# Patient Record
Sex: Male | Born: 1950 | Race: White | Hispanic: No | Marital: Single | Smoking: Former smoker
Health system: Southern US, Community
[De-identification: ages and names within clinical notes are randomized; demographics above are authoritative.]

## PROBLEM LIST (undated history)

## (undated) DIAGNOSIS — Z720 Tobacco use: Secondary | ICD-10-CM

## (undated) DIAGNOSIS — J439 Emphysema, unspecified: Secondary | ICD-10-CM

## (undated) DIAGNOSIS — R569 Unspecified convulsions: Secondary | ICD-10-CM

## (undated) HISTORY — PX: LAPAROTOMY: SHX154

## (undated) HISTORY — PX: KNEE SURGERY: SHX244

## (undated) HISTORY — DX: Hypercalcemia: E83.52

## (undated) HISTORY — DX: Emphysema, unspecified: J43.9

---

## 2005-04-23 ENCOUNTER — Emergency Department: Payer: Self-pay | Admitting: Emergency Medicine

## 2005-04-26 ENCOUNTER — Ambulatory Visit: Payer: Self-pay | Admitting: Orthopaedic Surgery

## 2005-05-05 ENCOUNTER — Ambulatory Visit: Payer: Self-pay | Admitting: Orthopaedic Surgery

## 2011-02-10 ENCOUNTER — Emergency Department: Payer: Self-pay | Admitting: Emergency Medicine

## 2020-07-19 ENCOUNTER — Emergency Department: Payer: Medicare Other

## 2020-07-19 ENCOUNTER — Inpatient Hospital Stay
Admission: EM | Admit: 2020-07-19 | Discharge: 2020-07-25 | DRG: 823 | Disposition: A | Discharge status: Home Health | Payer: Medicare Other | Attending: Internal Medicine | Admitting: Internal Medicine

## 2020-07-19 ENCOUNTER — Encounter: Payer: Self-pay | Admitting: Emergency Medicine

## 2020-07-19 ENCOUNTER — Other Ambulatory Visit: Payer: Self-pay

## 2020-07-19 DIAGNOSIS — R918 Other nonspecific abnormal finding of lung field: Secondary | ICD-10-CM

## 2020-07-19 DIAGNOSIS — R627 Adult failure to thrive: Secondary | ICD-10-CM | POA: Diagnosis present

## 2020-07-19 DIAGNOSIS — D61818 Other pancytopenia: Secondary | ICD-10-CM | POA: Diagnosis present

## 2020-07-19 DIAGNOSIS — R630 Anorexia: Secondary | ICD-10-CM | POA: Diagnosis present

## 2020-07-19 DIAGNOSIS — C8334 Diffuse large B-cell lymphoma, lymph nodes of axilla and upper limb: Secondary | ICD-10-CM | POA: Diagnosis present

## 2020-07-19 DIAGNOSIS — K859 Acute pancreatitis without necrosis or infection, unspecified: Secondary | ICD-10-CM | POA: Diagnosis present

## 2020-07-19 DIAGNOSIS — F17210 Nicotine dependence, cigarettes, uncomplicated: Secondary | ICD-10-CM | POA: Diagnosis present

## 2020-07-19 DIAGNOSIS — Z825 Family history of asthma and other chronic lower respiratory diseases: Secondary | ICD-10-CM | POA: Diagnosis not present

## 2020-07-19 DIAGNOSIS — J439 Emphysema, unspecified: Secondary | ICD-10-CM | POA: Diagnosis present

## 2020-07-19 DIAGNOSIS — E162 Hypoglycemia, unspecified: Secondary | ICD-10-CM | POA: Diagnosis present

## 2020-07-19 DIAGNOSIS — Z681 Body mass index (BMI) 19 or less, adult: Secondary | ICD-10-CM | POA: Diagnosis not present

## 2020-07-19 DIAGNOSIS — E876 Hypokalemia: Secondary | ICD-10-CM | POA: Diagnosis present

## 2020-07-19 DIAGNOSIS — B370 Candidal stomatitis: Secondary | ICD-10-CM | POA: Diagnosis present

## 2020-07-19 DIAGNOSIS — Z20822 Contact with and (suspected) exposure to covid-19: Secondary | ICD-10-CM | POA: Diagnosis present

## 2020-07-19 DIAGNOSIS — R590 Localized enlarged lymph nodes: Secondary | ICD-10-CM | POA: Diagnosis present

## 2020-07-19 DIAGNOSIS — C8590 Non-Hodgkin lymphoma, unspecified, unspecified site: Secondary | ICD-10-CM

## 2020-07-19 DIAGNOSIS — R634 Abnormal weight loss: Secondary | ICD-10-CM | POA: Diagnosis not present

## 2020-07-19 HISTORY — DX: Unspecified convulsions: R56.9

## 2020-07-19 HISTORY — DX: Tobacco use: Z72.0

## 2020-07-19 HISTORY — DX: Other pancytopenia: D61.818

## 2020-07-19 LAB — URINALYSIS, COMPLETE (UACMP) WITH MICROSCOPIC
Bilirubin Urine: NEGATIVE
Glucose, UA: NEGATIVE mg/dL
Ketones, ur: NEGATIVE mg/dL
Leukocytes,Ua: NEGATIVE
Nitrite: NEGATIVE
Protein, ur: NEGATIVE mg/dL
Specific Gravity, Urine: 1.011 (ref 1.005–1.030)
Squamous Epithelial / HPF: NONE SEEN (ref 0–5)
pH: 5 (ref 5.0–8.0)

## 2020-07-19 LAB — BASIC METABOLIC PANEL
Anion gap: 14 (ref 5–15)
BUN: 61 mg/dL — ABNORMAL HIGH (ref 8–23)
CO2: 22 mmol/L (ref 22–32)
Calcium: 13.8 mg/dL (ref 8.9–10.3)
Chloride: 93 mmol/L — ABNORMAL LOW (ref 98–111)
Creatinine, Ser: 1.65 mg/dL — ABNORMAL HIGH (ref 0.61–1.24)
GFR, Estimated: 45 mL/min — ABNORMAL LOW (ref >60)
Glucose, Bld: 95 mg/dL (ref 70–99)
Potassium: 3.9 mmol/L (ref 3.5–5.1)
Sodium: 129 mmol/L — ABNORMAL LOW (ref 135–145)

## 2020-07-19 LAB — CBC
HCT: 29.3 % — ABNORMAL LOW (ref 39.0–52.0)
Hemoglobin: 10.1 g/dL — ABNORMAL LOW (ref 13.0–17.0)
MCH: 30.2 pg (ref 26.0–34.0)
MCHC: 34.5 g/dL (ref 30.0–36.0)
MCV: 87.7 fL (ref 80.0–100.0)
Platelets: 87 10*3/uL — ABNORMAL LOW (ref 150–400)
RBC: 3.34 MIL/uL — ABNORMAL LOW (ref 4.22–5.81)
RDW: 19.2 % — ABNORMAL HIGH (ref 11.5–15.5)
WBC: 2.6 10*3/uL — ABNORMAL LOW (ref 4.0–10.5)
nRBC: 0 % (ref 0.0–0.2)

## 2020-07-19 LAB — RESP PANEL BY RT-PCR (FLU A&B, COVID) ARPGX2
Influenza A by PCR: NEGATIVE
Influenza B by PCR: NEGATIVE
SARS Coronavirus 2 by RT PCR: NEGATIVE

## 2020-07-19 LAB — CK: Total CK: 46 U/L — ABNORMAL LOW (ref 49–397)

## 2020-07-19 LAB — TROPONIN I (HIGH SENSITIVITY): Troponin I (High Sensitivity): 17 ng/L (ref <18)

## 2020-07-19 MED ORDER — TRAZODONE HCL 50 MG PO TABS: 25.0000 mg | ORAL_TABLET | Freq: Every evening | ORAL | Status: DC | PRN

## 2020-07-19 MED ORDER — SODIUM CHLORIDE 0.9 % IV BOLUS
1000.0000 mL | Freq: Once | INTRAVENOUS | Status: AC
  Administered 2020-07-19: 1000 mL via INTRAVENOUS

## 2020-07-19 MED ORDER — SODIUM CHLORIDE 0.9 % IV SOLN: INTRAVENOUS | Status: DC

## 2020-07-19 MED ORDER — ACETAMINOPHEN 650 MG RE SUPP: 650.0000 mg | Freq: Four times a day (QID) | RECTAL | Status: DC | PRN

## 2020-07-19 MED ORDER — ACETAMINOPHEN 325 MG PO TABS
650.0000 mg | ORAL_TABLET | Freq: Four times a day (QID) | ORAL | Status: DC | PRN
  Administered 2020-07-21: 650 mg via ORAL
  Filled 2020-07-19: qty 2

## 2020-07-19 MED ORDER — ENOXAPARIN SODIUM 40 MG/0.4ML IJ SOSY
40.0000 mg | PREFILLED_SYRINGE | INTRAMUSCULAR | Status: DC
  Administered 2020-07-19 – 2020-07-24 (×6): 40 mg via SUBCUTANEOUS
  Filled 2020-07-19 (×6): qty 0.4

## 2020-07-19 NOTE — ED Notes (Signed)
Patient transported to CT 

## 2020-07-19 NOTE — ED Triage Notes (Addendum)
Pt via EMS from [facility]. Neighbors called EMS stating he wasn't acting himself that he was weaker than normal. Pt has not been eating for the past couple of days. Denies pain. Per EMS, pt was hypotensive at 89/67 and gave 553mL bolus. Pt is A&Ox4 and NAD. Pt is HOH and able to hear better on the R side.

## 2020-07-19 NOTE — H&P (Signed)
History and Physical    Patrick Shepard HER:[id number] DOB: 1950-12-30 DOA: 07/19/2020  PCP: Pcp, No (Confirm with patient/family/NH records and if not entered, this has to be entered at [facility] point of entry) Patient coming from: Assisted Living  I have personally briefly reviewed patient's old medical records in [place]  Chief Complaint: weakness  HPI: Patrick Shepard is a 70 y.o. male with no prior  medical history, hasn't seen a doctor for many years.  He presents to the emergency department for generalized weakness.  Patient is coming from the [facility] for generalized weakness.  Patient states he goes outside to smoke cigarettes several times during the day states he has been so weak that he was unable to do so today.He also required assistance to shower.   Patient denies any chest pain or abdominal pain.  Denies any nausea vomiting or diarrhea. .  ED Course: T 99  95/71  HR 83  R22. Patient appears chronically ill. He has had 20+ lbs weight loss since December per his sister. Lab: Na 129, Cr 1.65, BUN 61  Ca 13.8  Trop 45, WBC 2.6, Hgb 10.1 Plt 87. EKG with old anterior injury. CXR NAD. TRH called to admit for further evaluation of weight loss, pancytopenia, hypercalcemia.  Review of Systems: As per HPI otherwise 10 point review of systems negative. HOH, has daily cough, has had loss of appetite for many weeks, hardly eating at all for 3 weeks.    Past Medical History:  Diagnosis Date  . Seizure in childhood [facility])    no seizure for many years  . Tobacco abuse     Past Surgical History:  Procedure Laterality Date  . KNEE SURGERY Left    for injury. Did not have knee replacement  . LAPAROTOMY     done at age 48 for intestinal issues    Soc Hx- was married twice, divorced twice. No children. [place]. 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 Heavy smoker for many years.   reports  that he has been smoking. He has been smoking about 1.00 pack per day. He has quit using smokeless tobacco. No history on file for alcohol use and drug use.  No Known Allergies  Family History  Problem Relation Age of Onset  . COPD Mother   . Alcoholism Father   . Cirrhosis Father      Prior to Admission medications   Not on File  Takes no medications  Physical Exam: Vitals:   07/19/20 1930 07/19/20 2015 07/19/20 2030 07/19/20 2146  BP: 95/71 99/63 106/67 115/76  Pulse: 83 88 87 90  Resp: (!) 22 17 18 20   Temp:    98.6 F (37 C)  TempSrc:    Oral  SpO2: 99% 98% 98% 100%  Weight:      Height:         Vitals:   07/19/20 1930 07/19/20 2015 07/19/20 2030 07/19/20 2146  BP: 95/71 99/63 106/67 115/76  Pulse: 83 88 87 90  Resp: (!) 22 17 18 20   Temp:    98.6 F (37 C)  TempSrc:    Oral  SpO2: 99% 98% 98% 100%  Weight:      Height:       General: Very thin with temporal wasting, prominent ribs, prominent spinous processes. Eyes: PERRL, lids and conjunctivae normal ENMT: Mucous membranes are moist. Posterior pharynx  clear of any exudate or lesions. Missing teeth maxilla, poor dentition mandibule.  Neck: normal, supple, no masses, no thyromegaly Nodes - no palpable nodes supraclavicular, axillary. Small node right groin Respiratory: clear to auscultation bilaterally, no wheezing, no crackles. Normal respiratory effort. No accessory muscle use.  Cardiovascular: Regular rate and rhythm, no murmurs / rubs / gallops. No extremity edema. 2+ pedal pulses. No carotid bruits.  Abdomen: scaphoid, no tenderness, no masses palpated. No hepatosplenomegaly. Bowel sounds positive.  Musculoskeletal: no clubbing / cyanosis. No joint deformity upper and lower extremities. Surgical scar left knee. Good ROM, no contractures. Normal muscle tone.  Skin: no rashes, lesions, ulcers. No induration Neurologic: CN 2-12 grossly intact but HOH. Sensation intact, DTR hyporeflexic  Patellar tendon,  hyper-reflexic at achilles.. Strength 5/5 in all 4.  Psychiatric: Normal judgment but lacks insight to his physical state. Alert and oriented x 3. Normal mood.     Labs on Admission: I have personally reviewed following labs and imaging studies  CBC: Recent Labs  Lab 07/19/20 1751  WBC 2.6*  HGB 10.1*  HCT 29.3*  MCV 87.7  PLT 87*   Basic Metabolic Panel: Recent Labs  Lab 07/19/20 1751  NA 129*  K 3.9  CL 93*  CO2 22  GLUCOSE 95  BUN 61*  CREATININE 1.65*  CALCIUM 13.8*   GFR: Estimated Creatinine Clearance: 40.9 mL/min (A) (by C-G formula based on SCr of 1.65 mg/dL (H)). Liver Function Tests: No results for input(s): AST, ALT, ALKPHOS, BILITOT, PROT, ALBUMIN in the last 168 hours. No results for input(s): LIPASE, AMYLASE in the last 168 hours. No results for input(s): AMMONIA in the last 168 hours. Coagulation Profile: No results for input(s): INR, PROTIME in the last 168 hours. Cardiac Enzymes: Recent Labs  Lab 07/19/20 1751  CKTOTAL 46*   BNP (last 3 results) No results for input(s): PROBNP in the last 8760 hours. HbA1C: No results for input(s): HGBA1C in the last 72 hours. CBG: No results for input(s): GLUCAP in the last 168 hours. Lipid Profile: No results for input(s): CHOL, HDL, LDLCALC, TRIG, CHOLHDL, LDLDIRECT in the last 72 hours. Thyroid Function Tests: No results for input(s): TSH, T4TOTAL, FREET4, T3FREE, THYROIDAB in the last 72 hours. Anemia Panel: No results for input(s): VITAMINB12, FOLATE, FERRITIN, TIBC, IRON, RETICCTPCT in the last 72 hours. Urine analysis:    Component Value Date/Time   COLORURINE YELLOW (A) 07/19/2020 2044   APPEARANCEUR CLEAR (A) 07/19/2020 2044   LABSPEC 1.011 07/19/2020 2044   PHURINE 5.0 07/19/2020 2044   GLUCOSEU NEGATIVE 07/19/2020 2044   HGBUR SMALL (A) 07/19/2020 2044   BILIRUBINUR NEGATIVE 07/19/2020 2044   KETONESUR NEGATIVE 07/19/2020 2044   PROTEINUR NEGATIVE 07/19/2020 2044   NITRITE NEGATIVE  07/19/2020 2044   LEUKOCYTESUR NEGATIVE 07/19/2020 2044    Radiological Exams on Admission: CT Chest Wo Contrast  Result Date: 07/19/2020 CLINICAL DATA:  Dyspnea. Interstitial lung disease suspected. Altered mental status over the last couple of days. EXAM: CT CHEST WITHOUT CONTRAST TECHNIQUE: Multidetector CT imaging of the chest was performed following the standard protocol without IV contrast. COMPARISON:  Current and prior chest radiographs. FINDINGS: Cardiovascular: Heart normal in size. Coronary artery calcifications. Aortic atherosclerosis. Mediastinum/Nodes: Enlarged right axillary lymph nodes, largest measuring 1.3 cm in short axis. There are shoddy subcentimeter neck base lymph nodes. There are several prominent to mildly enlarged mediastinal lymph nodes. Azygos level right paratracheal node measures 1.3 cm in short axis. Subcarinal node measures 1.8 cm in short axis. Probable mildly  enlarged inferior right hilar lymph node, 1.3 cm in short axis. Trachea and esophagus are unremarkable. Lungs/Pleura: Lungs demonstrate interstitial thickening peripheral cystic spaces, with a predominance in the mid to upper lungs. There is an ill-defined 1.1 cm nodular opacity in the left lower lobe, image 91, series 3. No convincing pneumonia. No pulmonary edema. No pleural effusion or pneumothorax. Upper Abdomen: Enlarged spleen, 15 cm in greatest transverse dimension. Prominent to mildly enlarged lymph nodes in the upper abdomen, in the visualized retroperitoneum and along the Peri celiac chain. Low-density liver lesions consistent with cysts. Musculoskeletal: Skeletal structures are demineralized. There multiple vertebral compression fractures, T4, T8, T9 as well as L1, which appear chronic. No bone lesions. IMPRESSION: 1. Interstitial lung disease reflected by peripheral cystic change, interstitial thickening, areas of honeycombing and architectural distortion, with a mid to upper lung predominance. 2. 1.1 cm  ill-defined nodular opacity in the left lower lobe. This could be inflammatory. Neoplastic disease is possible. Consider one of the following in 3 months for both low-risk and high-risk individuals: (a) repeat chest CT, (b) follow-up PET-CT, or (c) tissue sampling. This recommendation follows the consensus statement: Guidelines for Management of Incidental Pulmonary Nodules Detected on CT Images: From the Fleischner Society 2017; Radiology 2017; 284:228-243. No other findings in the lungs to suggest active inflammation or infection. No pulmonary edema. 3. Enlarged lymph nodes, most apparent in the right axilla, milder adenopathy along the mediastinum and right hilum, with adenopathy also noted in the visualized upper abdomen. There is also splenomegaly. The combination of findings is concerning for a lymphoproliferative disorder including lymphoma. 4. Coronary artery calcifications and aortic atherosclerosis. Aortic Atherosclerosis (ICD10-I70.0). Electronically Signed   By: Lajean Manes M.D.   On: 07/19/2020 21:07   DG Chest Portable 1 View  Result Date: 07/19/2020 CLINICAL DATA:  Weakness. EXAM: PORTABLE CHEST 1 VIEW COMPARISON:  02/10/2011. FINDINGS: Cardiac silhouette is normal in size. Normal mediastinal and hilar contours. Lungs show bilateral interstitial thickening similar to the prior exam allowing for differences in radiographic technique. No lung consolidation. No pleural effusion or pneumothorax. Skeletal structures are grossly intact. IMPRESSION: 1. No acute cardiopulmonary disease. 2. Chronic bilateral interstitial thickening. Electronically Signed   By: Lajean Manes M.D.   On: 07/19/2020 19:56    EKG: Independently reviewed. Sinus rhythm w/o acute injury. Old anterior injury  Assessment/Plan Active Problems:   Hypercalcemia   Weight loss, abnormal   Pancytopenia (HCC)   Emphysema lung ([place])    1. Hypercalcemia - patient not symptomatic with calcium 13.8. CT chest w/o mass, small  nodule noted. Multiple locations lymphadenopathy. Plan IVF - received 2L NS in ED, will continue IV hydration with NS  iPTH and ionized calcium pending  Will need heme/onc consult for possible lymphoprolifitive disease  F/u Calcium level and Bmet in AM  2. Pancytopenia and weight loss - likely related to #1 and will need heme/onc consult  3. Pulmonary - heavy smoker at 2 ppd for many years. CT reveals probable emphysema. No oxygen needs. No wheezing.    DVT prophylaxis: lovenox  Code Status: full code  Family Communication: sister present during interview and exam and was a source of information.  Disposition Plan: TBD  Consults called: heme/onc - will need to be called in AM  Admission status: inpatient    Adella Hare MD [facility] Pager [phone]  If 7PM-7AM, please contact night-coverage [email or web address] Password [facility]  07/19/2020, 9:50 PM

## 2020-07-19 NOTE — ED Notes (Signed)
Ice chips given at this time. EDP approval at this time.

## 2020-07-19 NOTE — ED Notes (Signed)
ED Provider at bedside. 

## 2020-07-19 NOTE — ED Notes (Signed)
Hospitalist at bedside 

## 2020-07-19 NOTE — ED Notes (Signed)
X-ray at bedside

## 2020-07-19 NOTE — ED Provider Notes (Signed)
[facility] Emergency Department Provider Note  Time seen: 6:10 PM  I have reviewed the triage vital signs and the nursing notes.   HISTORY  Chief Complaint Weakness   HPI Patrick Shepard is a 70 y.o. male presents to the emergency department for generalized weakness.  Patient is coming from the [facility] home for generalized weakness.  Patient states he goes outside to smoke cigarettes several times during the day states he has been so weak that he was unable to do so today.   Patient denies any chest pain or abdominal pain.  Denies any nausea vomiting or diarrhea.  Patient found to be hypotensive upon arrival 89/67 of EMS and given 500 cc of fluid, remains hypotensive around 90 systolic upon arrival.  Largely negative review of systems otherwise besides generalized weakness.  No past medical history on file.  There are no problems to display for this patient.   Prior to Admission medications   Not on File    No Known Allergies  No family history on file.  Social History Social History   Tobacco Use  . Smoking status: Current Every Day Smoker    Packs/day: 1.00  . Smokeless tobacco: Former Network engineer  . Vaping Use: Never used    Review of Systems Constitutional: Positive for generalized weakness.  Negative for fever. Cardiovascular: Negative for chest pain. Respiratory: Negative for shortness of breath.  Negative for cough. Gastrointestinal: Negative for abdominal pain, vomiting and diarrhea. Genitourinary: Negative for urinary compaints Musculoskeletal: Negative for musculoskeletal complaints Neurological: Negative for headache All other ROS negative  ____________________________________________   PHYSICAL EXAM:  VITAL SIGNS: ED Triage Vitals  Enc Vitals Group     BP 07/19/20 1754 99/70     Pulse Rate 07/19/20 1754 90     Resp 07/19/20 1754 16     Temp 07/19/20 1754 99 F (37.2 C)     Temp Source 07/19/20 1754 Oral      SpO2 07/19/20 1754 100 %     Weight 07/19/20 1755 160 lb (72.6 kg)     Height 07/19/20 1755 5\' 8"  (1.727 m)     Head Circumference --      Peak Flow --      Pain Score 07/19/20 1755 0     Pain Loc --      Pain Edu? --      Excl. in [place]? --    Constitutional: Alert and oriented. Well appearing and in no distress. Eyes: Normal exam ENT      Head: Normocephalic and atraumatic.      Mouth/Throat: Mucous membranes are moist. Cardiovascular: Normal rate, regular rhythm. Respiratory: Normal respiratory effort without tachypnea nor retractions. Breath sounds are clear  Gastrointestinal: Soft and nontender. No distention. Musculoskeletal: Nontender with normal range of motion in all extremities. Neurologic:  Normal speech and language. No gross focal neurologic deficits  Skin:  Skin is warm, dry and intact.  Psychiatric: Mood and affect are normal.  ____________________________________________    EKG  EKG viewed and interpreted by myself shows a normal sinus rhythm at 98 bpm with a narrow QRS, normal axis, normal intervals, nonspecific ST changes.  ____________________________________________    RADIOLOGY  Chest x-ray is negative.  ____________________________________________   INITIAL IMPRESSION / ASSESSMENT AND PLAN / ED COURSE  Pertinent labs & imaging results that were available during my care of the patient were reviewed by me and considered in my medical decision making (see chart for details).  Patient presents emergency department for generalized weakness and fatigue today.  Largely negative review of systems otherwise.  Differential is quite broad but would include metabolic or electrolyte abnormality, dehydration, anemia, infectious etiologies such as COVID or urinary tract infection.  We will check labs and continue to closely monitor.  We will IV hydrate while awaiting results given the patient's initial borderline hypotension.  Patient's blood pressure is improving  with fluids currently 100/59.  Patient's labs have resulted showing significant hypercalcemia at 13.8.  Renal insufficiency with a creatinine 1.65 with no old labs for comparison.  Patient's COVID and flu tests are negative.  CBC shows mild anemia with leukopenia and thrombocytopenia.  Again no old labs for comparison.  We will continue with IV hydration for the hypercalcemia, troponin is pending as well as a urinalysis.  We will also obtain a chest x-ray as a precaution.  Patient will require admission to the hospital service once his emergency department work-up is been completed.  Patient remained stable in the emergency department.  Chest x-ray negative.  Patient cause for hypercalcemia is unclear at this time.  Given his chronic smoking history there is still considerable concern for oncologic process.  I spoke to the hospitalist who will be admitting the patient to his service he recommends CT scan of the chest without contrast as well as parathyroid hormone level and ionized calcium which I have ordered.  Patrick Shepard was evaluated in Emergency Department on 07/19/2020 for the symptoms described in the history of present illness. He was evaluated in the context of the global COVID-19 pandemic, which necessitated consideration that the patient might be at risk for infection with the SARS-CoV-2 virus that causes COVID-19. Institutional protocols and algorithms that pertain to the evaluation of patients at risk for COVID-19 are in a state of rapid change based on information released by regulatory bodies including the CDC and federal and state organizations. These policies and algorithms were followed during the patient's care in the ED.  ____________________________________________   FINAL CLINICAL IMPRESSION(S) / ED DIAGNOSES  Weakness Hypercalcemia   Patrick Dark, MD 07/19/20 2003

## 2020-07-20 ENCOUNTER — Encounter: Payer: Self-pay | Admitting: Internal Medicine

## 2020-07-20 ENCOUNTER — Inpatient Hospital Stay: Payer: Medicare Other

## 2020-07-20 DIAGNOSIS — R918 Other nonspecific abnormal finding of lung field: Secondary | ICD-10-CM

## 2020-07-20 HISTORY — DX: Other nonspecific abnormal finding of lung field: R91.8

## 2020-07-20 LAB — DIFFERENTIAL
Abs Immature Granulocytes: 0.03 10*3/uL (ref 0.00–0.07)
Basophils Absolute: 0 10*3/uL (ref 0.0–0.1)
Basophils Relative: 1 %
Eosinophils Absolute: 0 10*3/uL (ref 0.0–0.5)
Eosinophils Relative: 0 %
Immature Granulocytes: 1 %
Lymphocytes Relative: 13 %
Lymphs Abs: 0.3 10*3/uL — ABNORMAL LOW (ref 0.7–4.0)
Monocytes Absolute: 0.2 10*3/uL (ref 0.1–1.0)
Monocytes Relative: 8 %
Neutro Abs: 1.7 10*3/uL (ref 1.7–7.7)
Neutrophils Relative %: 77 %

## 2020-07-20 LAB — TECHNOLOGIST SMEAR REVIEW: Plt Morphology: DECREASED

## 2020-07-20 LAB — HEPATIC FUNCTION PANEL
ALT: 22 U/L (ref 0–44)
AST: 70 U/L — ABNORMAL HIGH (ref 15–41)
Albumin: 3 g/dL — ABNORMAL LOW (ref 3.5–5.0)
Alkaline Phosphatase: 65 U/L (ref 38–126)
Bilirubin, Direct: 0.5 mg/dL — ABNORMAL HIGH (ref 0.0–0.2)
Indirect Bilirubin: 1.1 mg/dL — ABNORMAL HIGH (ref 0.3–0.9)
Total Bilirubin: 1.6 mg/dL — ABNORMAL HIGH (ref 0.3–1.2)
Total Protein: 5 g/dL — ABNORMAL LOW (ref 6.5–8.1)

## 2020-07-20 LAB — BASIC METABOLIC PANEL
Anion gap: 10 (ref 5–15)
BUN: 56 mg/dL — ABNORMAL HIGH (ref 8–23)
CO2: 23 mmol/L (ref 22–32)
Calcium: 12.9 mg/dL — ABNORMAL HIGH (ref 8.9–10.3)
Chloride: 100 mmol/L (ref 98–111)
Creatinine, Ser: 1.41 mg/dL — ABNORMAL HIGH (ref 0.61–1.24)
GFR, Estimated: 54 mL/min — ABNORMAL LOW (ref >60)
Glucose, Bld: 82 mg/dL (ref 70–99)
Potassium: 3.9 mmol/L (ref 3.5–5.1)
Sodium: 133 mmol/L — ABNORMAL LOW (ref 135–145)

## 2020-07-20 LAB — CBC
HCT: 31.2 % — ABNORMAL LOW (ref 39.0–52.0)
Hemoglobin: 10.7 g/dL — ABNORMAL LOW (ref 13.0–17.0)
MCH: 30.4 pg (ref 26.0–34.0)
MCHC: 34.3 g/dL (ref 30.0–36.0)
MCV: 88.6 fL (ref 80.0–100.0)
Platelets: 83 10*3/uL — ABNORMAL LOW (ref 150–400)
RBC: 3.52 MIL/uL — ABNORMAL LOW (ref 4.22–5.81)
RDW: 19.9 % — ABNORMAL HIGH (ref 11.5–15.5)
WBC: 2.2 10*3/uL — ABNORMAL LOW (ref 4.0–10.5)
nRBC: 0 % (ref 0.0–0.2)

## 2020-07-20 LAB — LACTATE DEHYDROGENASE: LDH: 254 U/L — ABNORMAL HIGH (ref 98–192)

## 2020-07-20 MED ORDER — SODIUM CHLORIDE 0.9 % IV SOLN
12.5000 mg | Freq: Four times a day (QID) | INTRAVENOUS | Status: DC | PRN
  Filled 2020-07-20: qty 0.5

## 2020-07-20 MED ORDER — CALCITONIN (SALMON) 200 UNIT/ML IJ SOLN
4.0000 [IU]/kg | Freq: Two times a day (BID) | INTRAMUSCULAR | Status: AC
  Administered 2020-07-20 – 2020-07-21 (×4): 290 [IU] via SUBCUTANEOUS
  Filled 2020-07-20 (×4): qty 1.45

## 2020-07-20 MED ORDER — ZOLEDRONIC ACID 4 MG/5ML IV CONC
4.0000 mg | Freq: Once | INTRAVENOUS | Status: AC
  Administered 2020-07-20: 4 mg via INTRAVENOUS
  Filled 2020-07-20: qty 5

## 2020-07-20 MED ORDER — ONDANSETRON HCL 4 MG/2ML IJ SOLN
4.0000 mg | Freq: Four times a day (QID) | INTRAMUSCULAR | Status: DC | PRN
  Administered 2020-07-20 – 2020-07-21 (×2): 4 mg via INTRAVENOUS
  Filled 2020-07-20 (×2): qty 2

## 2020-07-20 MED ORDER — IOHEXOL 9 MG/ML PO SOLN
500.0000 mL | ORAL | Status: AC
  Administered 2020-07-20 (×2): 500 mL via ORAL

## 2020-07-20 MED ORDER — OXYCODONE HCL 5 MG PO TABS
5.0000 mg | ORAL_TABLET | ORAL | Status: DC | PRN
  Administered 2020-07-22: 5 mg via ORAL
  Filled 2020-07-20: qty 1

## 2020-07-20 MED ORDER — LORAZEPAM 1 MG PO TABS
1.0000 mg | ORAL_TABLET | Freq: Four times a day (QID) | ORAL | Status: DC | PRN
  Administered 2020-07-22: 1 mg via ORAL
  Filled 2020-07-20: qty 1

## 2020-07-20 NOTE — Progress Notes (Signed)
Progress Note    Patrick Shepard   ZOX:[id number]  DOB: 08/08/1950  DOA: 07/19/2020     1  PCP: Pcp, No   CC: weight loss, weakness  Hospital Course: Patrick Shepard is a 70 y.o. male with PMH tobacco use who presented to the ER with worsening weakness at home.  He is an ongoing tobacco user with approximately 100 pack-year smoking history (still smoking approximately 2 packs/day).  He also has not sought any medical care since approximately 2007 due to an aversion to healthcare per his sister.  She also stated that she has not seen him since Christmas and he had lost a significant amount of weight since then, possibly 20 to 30 pounds she guesses. He underwent CT chest on admission which showed a left lower lobe opacity measuring 1.1 cm and enlarged axillary lymphadenopathy along with mediastinal lymphadenopathy. He had notable hypercalcemia on admission as well.  He was started on fluids and admitted for further work-up and treatment. Oncology was also consulted on admission.   Interval History:  Seen this morning in his room with his sister bedside.  We reviewed CT findings and underlying suspicion for malignancy.  He also confirmed his longstanding significant smoking history.  Patient understands further work-up being commenced for the underlying lung mass.  ROS: Constitutional: negative for chills and fevers, Respiratory: negative for cough, Cardiovascular: negative for chest pain and Gastrointestinal: negative for abdominal pain  Assessment & Plan:  Lung lesion - Left lower lobe opacity measuring 1.1 cm with associated axillary and mediastinal lymphadenopathy seen on CT chest.  Given his longstanding smoking history, concern would be for underlying malignancy.  There is also hypercalcemia present on admission - Oncology consulted for assistance with further work-up - Follow-up CT abdomen/pelvis for further staging  Hypercalcemia  - Calcium 13.8 on admission but no  albumin to calculate corrected calcium however this morning his albumin is 3 so likely had pretty significant hypercalcemia on admission with correction - Will give calcitonin and zoledronic acid - Follow-up repeat calcium levels - Start on fluids  Pancytopenia - suspected due to underlying malignancy as noted above  Tobacco use - patient states started smoking approx 70 years old. Has approx 100 pack year history - cessation was recommended strongly.  - nicotine patch as needed  Old records reviewed in assessment of this patient  Antimicrobials:   DVT prophylaxis: enoxaparin (LOVENOX) injection 40 mg Start: 07/19/20 2200   Code Status:   Code Status: Full Code Family Communication: sister  Disposition Plan: Status is: Inpatient  Remains inpatient appropriate because:Ongoing diagnostic testing needed not appropriate for outpatient work up, IV treatments appropriate due to intensity of illness or inability to take PO and Inpatient level of care appropriate due to severity of illness   Dispo: The patient is from: Home              Anticipated d/c is to: Home              Patient currently is not medically stable to d/c.   Difficult to place patient No  Risk of unplanned readmission score: Unplanned Admission- Pilot do not use: 10.47   Objective: Blood pressure 116/79, pulse 83, temperature 97.7 F (36.5 C), temperature source Oral, resp. rate 20, height 5\' 8"  (1.727 m), weight 72.6 kg, SpO2 100 %.  Examination: General appearance: Cachectic adult man laying in bed appearing older than stated age but is comfortable Head: Normocephalic, without obvious abnormality, atraumatic Eyes: EOMI Lungs: clear  to auscultation bilaterally Heart: regular rate and rhythm and S1, S2 normal Abdomen: Prominently exposed ribs due to cachexia.  Thin, nontender, bowel sounds present Extremities: No edema Skin: mobility and turgor normal Neurologic: Grossly normal  Consultants:    Oncology  Procedures:     Data Reviewed: I have personally reviewed following labs and imaging studies Results for orders placed or performed during the hospital encounter of 07/19/20 (from the past 24 hour(s))  Basic metabolic panel     Status: Abnormal   Collection Time: 07/19/20  5:51 PM  Result Value Ref Range   Sodium 129 (L) 135 - 145 mmol/L   Potassium 3.9 3.5 - 5.1 mmol/L   Chloride 93 (L) 98 - 111 mmol/L   CO2 22 22 - 32 mmol/L   Glucose, Bld 95 70 - 99 mg/dL   BUN 61 (H) 8 - 23 mg/dL   Creatinine, Ser 1.65 (H) 0.61 - 1.24 mg/dL   Calcium 13.8 (HH) 8.9 - 10.3 mg/dL   GFR, Estimated 45 (L) >60 mL/min   Anion gap 14 5 - 15  CBC     Status: Abnormal   Collection Time: 07/19/20  5:51 PM  Result Value Ref Range   WBC 2.6 (L) 4.0 - 10.5 K/uL   RBC 3.34 (L) 4.22 - 5.81 MIL/uL   Hemoglobin 10.1 (L) 13.0 - 17.0 g/dL   HCT 29.3 (L) 39.0 - 52.0 %   MCV 87.7 80.0 - 100.0 fL   MCH 30.2 26.0 - 34.0 pg   MCHC 34.5 30.0 - 36.0 g/dL   RDW 19.2 (H) 11.5 - 15.5 %   Platelets 87 (L) 150 - 400 K/uL   nRBC 0.0 0.0 - 0.2 %  CK     Status: Abnormal   Collection Time: 07/19/20  5:51 PM  Result Value Ref Range   Total CK 46 (L) 49 - 397 U/L  Troponin I (High Sensitivity)     Status: None   Collection Time: 07/19/20  5:51 PM  Result Value Ref Range   Troponin I (High Sensitivity) 17 <18 ng/L  Resp Panel by RT-PCR (Flu A&B, Covid) Nasopharyngeal Swab     Status: None   Collection Time: 07/19/20  6:26 PM   Specimen: Nasopharyngeal Swab; Nasopharyngeal(NP) swabs in vial transport medium  Result Value Ref Range   SARS Coronavirus 2 by RT PCR NEGATIVE NEGATIVE   Influenza A by PCR NEGATIVE NEGATIVE   Influenza B by PCR NEGATIVE NEGATIVE  Urinalysis, Complete w Microscopic Urine, Clean Catch     Status: Abnormal   Collection Time: 07/19/20  8:44 PM  Result Value Ref Range   Color, Urine YELLOW (A) YELLOW   APPearance CLEAR (A) CLEAR   Specific Gravity, Urine 1.011 1.005 - 1.030    pH 5.0 5.0 - 8.0   Glucose, UA NEGATIVE NEGATIVE mg/dL   Hgb urine dipstick SMALL (A) NEGATIVE   Bilirubin Urine NEGATIVE NEGATIVE   Ketones, ur NEGATIVE NEGATIVE mg/dL   Protein, ur NEGATIVE NEGATIVE mg/dL   Nitrite NEGATIVE NEGATIVE   Leukocytes,Ua NEGATIVE NEGATIVE   RBC / HPF 0-5 0 - 5 RBC/hpf   WBC, UA 0-5 0 - 5 WBC/hpf   Bacteria, UA RARE (A) NONE SEEN   Squamous Epithelial / LPF NONE SEEN 0 - 5   Mucus PRESENT    Sperm, UA PRESENT   Basic metabolic panel     Status: Abnormal   Collection Time: 07/20/20  6:07 AM  Result Value Ref Range   Sodium  133 (L) 135 - 145 mmol/L   Potassium 3.9 3.5 - 5.1 mmol/L   Chloride 100 98 - 111 mmol/L   CO2 23 22 - 32 mmol/L   Glucose, Bld 82 70 - 99 mg/dL   BUN 56 (H) 8 - 23 mg/dL   Creatinine, Ser 1.41 (H) 0.61 - 1.24 mg/dL   Calcium 12.9 (H) 8.9 - 10.3 mg/dL   GFR, Estimated 54 (L) >60 mL/min   Anion gap 10 5 - 15  Hepatic function panel     Status: Abnormal   Collection Time: 07/20/20 11:18 AM  Result Value Ref Range   Total Protein 5.0 (L) 6.5 - 8.1 g/dL   Albumin 3.0 (L) 3.5 - 5.0 g/dL   AST 70 (H) 15 - 41 U/L   ALT 22 0 - 44 U/L   Alkaline Phosphatase 65 38 - 126 U/L   Total Bilirubin 1.6 (H) 0.3 - 1.2 mg/dL   Bilirubin, Direct 0.5 (H) 0.0 - 0.2 mg/dL   Indirect Bilirubin 1.1 (H) 0.3 - 0.9 mg/dL  Differential     Status: Abnormal   Collection Time: 07/20/20 11:18 AM  Result Value Ref Range   Neutrophils Relative % 77 %   Neutro Abs 1.7 1.7 - 7.7 K/uL   Lymphocytes Relative 13 %   Lymphs Abs 0.3 (L) 0.7 - 4.0 K/uL   Monocytes Relative 8 %   Monocytes Absolute 0.2 0.1 - 1.0 K/uL   Eosinophils Relative 0 %   Eosinophils Absolute 0.0 0.0 - 0.5 K/uL   Basophils Relative 1 %   Basophils Absolute 0.0 0.0 - 0.1 K/uL   Immature Granulocytes 1 %   Abs Immature Granulocytes 0.03 0.00 - 0.07 K/uL  Lactate dehydrogenase     Status: Abnormal   Collection Time: 07/20/20 11:18 AM  Result Value Ref Range   LDH 254 (H) 98 - 192 U/L   Technologist smear review     Status: None   Collection Time: 07/20/20 11:18 AM  Result Value Ref Range   WBC MORPHOLOGY MORPHOLOGY UNREMARKABLE    RBC MORPHOLOGY POLYCHROMASIA PRESENT    Tech Review PLATELETS APPEAR DECREASED   CBC     Status: Abnormal   Collection Time: 07/20/20 11:18 AM  Result Value Ref Range   WBC 2.2 (L) 4.0 - 10.5 K/uL   RBC 3.52 (L) 4.22 - 5.81 MIL/uL   Hemoglobin 10.7 (L) 13.0 - 17.0 g/dL   HCT 31.2 (L) 39.0 - 52.0 %   MCV 88.6 80.0 - 100.0 fL   MCH 30.4 26.0 - 34.0 pg   MCHC 34.3 30.0 - 36.0 g/dL   RDW 19.9 (H) 11.5 - 15.5 %   Platelets 83 (L) 150 - 400 K/uL   nRBC 0.0 0.0 - 0.2 %    Recent Results (from the past 240 hour(s))  Resp Panel by RT-PCR (Flu A&B, Covid) Nasopharyngeal Swab     Status: None   Collection Time: 07/19/20  6:26 PM   Specimen: Nasopharyngeal Swab; Nasopharyngeal(NP) swabs in vial transport medium  Result Value Ref Range Status   SARS Coronavirus 2 by RT PCR NEGATIVE NEGATIVE Final    Comment: (NOTE) SARS-CoV-2 target nucleic acids are NOT DETECTED.  The SARS-CoV-2 RNA is generally detectable in upper respiratory specimens during the acute phase of infection. The lowest concentration of SARS-CoV-2 viral copies this assay can detect is 138 copies/mL. A negative result does not preclude SARS-Cov-2 infection and should not be used as the sole basis for treatment or other  patient management decisions. A negative result may occur with  improper specimen collection/handling, submission of specimen other than nasopharyngeal swab, presence of viral mutation(s) within the areas targeted by this assay, and inadequate number of viral copies(<138 copies/mL). A negative result must be combined with clinical observations, patient history, and epidemiological information. The expected result is Negative.  Fact Sheet for Patients:  EntrepreneurPulse.com.au  Fact Sheet for Healthcare Providers:   IncredibleEmployment.be  This test is no t yet approved or cleared by the [place] FDA and  has been authorized for detection and/or diagnosis of SARS-CoV-2 by FDA under an Emergency Use Authorization (EUA). This EUA will remain  in effect (meaning this test can be used) for the duration of the COVID-19 declaration under Section 564(b)(1) of the Act, 21 U.S.C.section 360bbb-3(b)(1), unless the authorization is terminated  or revoked sooner.       Influenza A by PCR NEGATIVE NEGATIVE Final   Influenza B by PCR NEGATIVE NEGATIVE Final    Comment: (NOTE) The Xpert Xpress SARS-CoV-2/FLU/RSV plus assay is intended as an aid in the diagnosis of influenza from Nasopharyngeal swab specimens and should not be used as a sole basis for treatment. Nasal washings and aspirates are unacceptable for Xpert Xpress SARS-CoV-2/FLU/RSV testing.  Fact Sheet for Patients: EntrepreneurPulse.com.au  Fact Sheet for Healthcare Providers: IncredibleEmployment.be  This test is not yet approved or cleared by the [place] FDA and has been authorized for detection and/or diagnosis of SARS-CoV-2 by FDA under an Emergency Use Authorization (EUA). This EUA will remain in effect (meaning this test can be used) for the duration of the COVID-19 declaration under Section 564(b)(1) of the Act, 21 U.S.C. section 360bbb-3(b)(1), unless the authorization is terminated or revoked.  Performed at [facility], [address] [place], [place] [address]      Radiology Studies: CT Chest Wo Contrast  Result Date: 07/19/2020 CLINICAL DATA:  Dyspnea. Interstitial lung disease suspected. Altered mental status over the last couple of days. EXAM: CT CHEST WITHOUT CONTRAST TECHNIQUE: Multidetector CT imaging of the chest was performed following the standard protocol without IV contrast. COMPARISON:  Current and prior chest radiographs. FINDINGS:  Cardiovascular: Heart normal in size. Coronary artery calcifications. Aortic atherosclerosis. Mediastinum/Nodes: Enlarged right axillary lymph nodes, largest measuring 1.3 cm in short axis. There are shoddy subcentimeter neck base lymph nodes. There are several prominent to mildly enlarged mediastinal lymph nodes. Azygos level right paratracheal node measures 1.3 cm in short axis. Subcarinal node measures 1.8 cm in short axis. Probable mildly enlarged inferior right hilar lymph node, 1.3 cm in short axis. Trachea and esophagus are unremarkable. Lungs/Pleura: Lungs demonstrate interstitial thickening peripheral cystic spaces, with a predominance in the mid to upper lungs. There is an ill-defined 1.1 cm nodular opacity in the left lower lobe, image 91, series 3. No convincing pneumonia. No pulmonary edema. No pleural effusion or pneumothorax. Upper Abdomen: Enlarged spleen, 15 cm in greatest transverse dimension. Prominent to mildly enlarged lymph nodes in the upper abdomen, in the visualized retroperitoneum and along the Peri celiac chain. Low-density liver lesions consistent with cysts. Musculoskeletal: Skeletal structures are demineralized. There multiple vertebral compression fractures, T4, T8, T9 as well as L1, which appear chronic. No bone lesions. IMPRESSION: 1. Interstitial lung disease reflected by peripheral cystic change, interstitial thickening, areas of honeycombing and architectural distortion, with a mid to upper lung predominance. 2. 1.1 cm ill-defined nodular opacity in the left lower lobe. This could be inflammatory. Neoplastic disease is possible. Consider one of the  following in 3 months for both low-risk and high-risk individuals: (a) repeat chest CT, (b) follow-up PET-CT, or (c) tissue sampling. This recommendation follows the consensus statement: Guidelines for Management of Incidental Pulmonary Nodules Detected on CT Images: From the Fleischner Society 2017; Radiology 2017; 284:228-243. No  other findings in the lungs to suggest active inflammation or infection. No pulmonary edema. 3. Enlarged lymph nodes, most apparent in the right axilla, milder adenopathy along the mediastinum and right hilum, with adenopathy also noted in the visualized upper abdomen. There is also splenomegaly. The combination of findings is concerning for a lymphoproliferative disorder including lymphoma. 4. Coronary artery calcifications and aortic atherosclerosis. Aortic Atherosclerosis (ICD10-I70.0). Electronically Signed   By: Lajean Manes M.D.   On: 07/19/2020 21:07   DG Chest Portable 1 View  Result Date: 07/19/2020 CLINICAL DATA:  Weakness. EXAM: PORTABLE CHEST 1 VIEW COMPARISON:  02/10/2011. FINDINGS: Cardiac silhouette is normal in size. Normal mediastinal and hilar contours. Lungs show bilateral interstitial thickening similar to the prior exam allowing for differences in radiographic technique. No lung consolidation. No pleural effusion or pneumothorax. Skeletal structures are grossly intact. IMPRESSION: 1. No acute cardiopulmonary disease. 2. Chronic bilateral interstitial thickening. Electronically Signed   By: Lajean Manes M.D.   On: 07/19/2020 19:56   CT Chest Wo Contrast  Final Result    DG Chest Portable 1 View  Final Result    CT ABDOMEN PELVIS WO CONTRAST    (Results Pending)    Scheduled Meds: . calcitonin  4 Units/kg Subcutaneous BID  . enoxaparin (LOVENOX) injection  40 mg Subcutaneous Q24H  . iohexol  500 mL Oral Q1H   PRN Meds: acetaminophen **OR** acetaminophen, oxyCODONE, traZODone Continuous Infusions: . sodium chloride 50 mL/hr at 07/19/20 2358  . zoledronic acid (ZOMETA) IV       LOS: 1 day  Time spent: Greater than 50% of the 35 minute visit was spent in counseling/coordination of care for the patient as laid out in the A&P.   Dwyane Dee, MD [facility] 07/20/2020, 1:21 PM

## 2020-07-20 NOTE — TOC Initial Note (Signed)
Transition of Care [facility]) - Initial/Assessment Note    Patient Details  Name: Patrick Shepard MRN: [id number] Date of Birth: 05-Jun-1950  [facility] CM/SW Contact:    Candie Chroman, LCSW Phone Number: 07/20/2020, 12:56 PM  Clinical Narrative:  CSW met with patient. No supports at bedside. CSW introduced role and explained that discharge planning would be discussed. Patient stated he is from [facility] but could not remember what kind of facility it is. He gave CSW permission to contact his sister. He wants her to become his HCPOA. Sent secure chat to RN requesting chaplain consult. CSW called patient's sister. She stated he lives in an apartment in an independent living facility. No home health or DME use prior to admission. No further concerns. CSW encouraged patient and his sister to contact CSW as needed. CSW will continue to follow patient and his sister for support and facilitate return home when stable.              Expected Discharge Plan: Home/Self Care Barriers to Discharge: Continued Medical Work up   Patient Goals and CMS Choice        Expected Discharge Plan and Services Expected Discharge Plan: Home/Self Care     Post Acute Care Choice: NA Living arrangements for the past 2 months: [place]                                      Prior Living Arrangements/Services Living arrangements for the past 2 months: [place] Lives with:: Self Patient language and need for interpreter reviewed:: Yes Do you feel safe going back to the place where you live?: Yes      Need for Family Participation in Patient Care: Yes (Comment) Care giver support system in place?: Yes (comment)   Criminal Activity/Legal Involvement Pertinent to Current Situation/Hospitalization: No - Comment as needed  Activities of Daily Living Home Assistive Devices/Equipment: None ADL Screening (condition at time of admission) Patient's  cognitive ability adequate to safely complete daily activities?: Yes Is the patient deaf or have difficulty hearing?: No Does the patient have difficulty seeing, even when wearing glasses/contacts?: No Does the patient have difficulty concentrating, remembering, or making decisions?: Yes Patient able to express need for assistance with ADLs?: Yes Does the patient have difficulty dressing or bathing?: Yes Independently performs ADLs?: Yes (appropriate for developmental age) Does the patient have difficulty walking or climbing stairs?: Yes Weakness of Legs: Both Weakness of Arms/Hands: None  Permission Sought/Granted Permission sought to share information with : Family Supports Permission granted to share information with : Yes, Verbal Permission Granted  Share Information with NAME: Patrick Shepard     Permission granted to share info w Relationship: Sister  Permission granted to share info w Contact Information: [phone]  Emotional Assessment Appearance:: Appears stated age Attitude/Demeanor/Rapport: Engaged,Gracious Affect (typically observed): Accepting,Appropriate,Calm,Pleasant Orientation: : Oriented to Self,Oriented to Place,Oriented to  Time,Oriented to Situation Alcohol / Substance Use: Not Applicable Psych Involvement: No (comment)  Admission diagnosis:  Hypercalcemia [E83.52] Patient Active Problem List   Diagnosis Date Noted  . Hypercalcemia 07/19/2020  . Emphysema lung ([place]) 07/19/2020  . Weight loss, abnormal 07/19/2020  . Pancytopenia ([place]) 07/19/2020   PCP:  Pcp, No Pharmacy:  No Pharmacies Listed    Social Determinants of Health (SDOH) Interventions    Readmission Risk Interventions No flowsheet data found.

## 2020-07-20 NOTE — Progress Notes (Signed)
   07/20/20 1705  Clinical Encounter Type  Visited With Patient  Visit Type Spiritual support;Social support (discuss advanced directive)  Referral From Nurse  Consult/Referral To Chaplain  Recommendations Follow-up with sister regarding AD; follow-up with Mr. Brailsford for support.  Spiritual Encounters  Spiritual Needs Prayer  Chaplain Burris met with Mr. Froning after receiving an order to discuss advanced directive planning. At this time Mr. Heffron is really too sick for much discussion. He asked me to return but he did request prayer. He indicated that his sister is often present so recommend follow-up discussion with her regarding AD planning. Also recommend follow-up pending diagnostic outcomes for continued assessment of pastoral support needs.

## 2020-07-20 NOTE — Consult Note (Signed)
[facility] CONSULT NOTE  Patient Care Team: Pcp, No as PCP - General  CHIEF COMPLAINTS/PURPOSE OF CONSULTATION: Hypercalcemia  HISTORY OF PRESENTING ILLNESS:  Patrick Shepard 70 y.o.  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  Patient lives in [place] home-is currently brought to the hospital for generalized weakness.  Patient denies any worsening chest pain or shortness of breath or cough.  Admits to poor appetite.  Possible weight loss.  In the hospital patient noted to have white count of 2.6 hemoglobin 10 platelets 87; calcium 13.8.  Creatinine 1.6.  In the emergency room patient had a CT scan which showed approximately 1 cm left lower lobe lung nodule; along with bilateral axial adenopathy' mediastinal adenopathy; interstitial changes of the lung.  CT of the abdomen pelvis-extensive retroperitoneal adenopathy with splenomegaly.   Review of Systems  Constitutional: Positive for malaise/fatigue and weight loss. Negative for chills, diaphoresis and fever.  HENT: Negative for nosebleeds and sore throat.   Eyes: Negative for double vision.  Respiratory: Positive for shortness of breath. Negative for cough, hemoptysis, sputum production and wheezing.   Cardiovascular: Negative for chest pain, palpitations, orthopnea and leg swelling.  Gastrointestinal: Positive for constipation. Negative for abdominal pain, blood in stool, diarrhea, heartburn, melena, nausea and vomiting.  Genitourinary: Negative for dysuria, frequency and urgency.  Musculoskeletal: Negative for back pain and joint pain.  Skin: Negative.  Negative for itching and rash.  Neurological: Positive for dizziness. Negative for tingling, focal weakness, weakness and headaches.  Endo/Heme/Allergies: Does not bruise/bleed easily.  Psychiatric/Behavioral: Negative for depression. The patient is not nervous/anxious and  does not have insomnia.      MEDICAL HISTORY:  Past Medical History:  Diagnosis Date  . Seizure in childhood [facility])    no seizure for many years  . Tobacco abuse     SURGICAL HISTORY: Past Surgical History:  Procedure Laterality Date  . KNEE SURGERY Left    for injury. Did not have knee replacement  . LAPAROTOMY     done at age 75 for intestinal issues    SOCIAL HISTORY: Social History   Socioeconomic History  . Marital status: Single    Spouse name: Not on file  . Number of children: Not on file  . Years of education: Not on file  . Highest education level: Not on file  Occupational History  . Not on file  Tobacco Use  . Smoking status: Current Every Day Smoker    Packs/day: 1.00  . Smokeless tobacco: Former Network engineer  . Vaping Use: Never used  Substance and Sexual Activity  . Alcohol use: Not on file  . Drug use: Not on file  . Sexual activity: Not on file  Other Topics Concern  . Not on file  Social History Narrative  . Not on file   Social Determinants of Health   Financial Resource Strain: Not on file  Food Insecurity: Not on file  Transportation Needs: Not on file  Physical Activity: Not on file  Stress: Not on file  Social Connections: Not on file  Intimate Partner Violence: Not on file    FAMILY HISTORY: Family History  Problem Relation Age of Onset  . COPD Mother   . Alcoholism Father   . Cirrhosis Father     ALLERGIES:  has No Known Allergies.  MEDICATIONS:  Current Facility-Administered Medications  Medication Dose Route Frequency Provider Last Rate  Last Admin  . 0.9 %  sodium chloride infusion   Intravenous Continuous Dwyane Dee, MD 150 mL/hr at 07/20/20 1557 New Bag at 07/20/20 1557  . acetaminophen (TYLENOL) tablet 650 mg  650 mg Oral Q6H PRN Norins, Heinz Knuckles, MD       Or  . acetaminophen (TYLENOL) suppository 650 mg  650 mg Rectal Q6H PRN Norins, Heinz Knuckles, MD      . calcitonin (MIACALCIN) injection 290 Units  4  Units/kg Subcutaneous BID Dwyane Dee, MD   290 Units at 07/20/20 2128  . enoxaparin (LOVENOX) injection 40 mg  40 mg Subcutaneous Q24H Norins, Heinz Knuckles, MD   40 mg at 07/20/20 2129  . LORazepam (ATIVAN) tablet 1 mg  1 mg Oral Q6H PRN Dwyane Dee, MD      . ondansetron [facility]) injection 4 mg  4 mg Intravenous Q6H PRN Dwyane Dee, MD   4 mg at 07/20/20 1520  . oxyCODONE (Oxy IR/ROXICODONE) immediate release tablet 5 mg  5 mg Oral Q4H PRN Dwyane Dee, MD      . promethazine (PHENERGAN) 12.5 mg in sodium chloride 0.9 % 50 mL IVPB  12.5 mg Intravenous Q6H PRN Dwyane Dee, MD      . traZODone (DESYREL) tablet 25 mg  25 mg Oral QHS PRN Neena Rhymes, MD          .  PHYSICAL EXAMINATION:  Vitals:   07/20/20 1533 07/20/20 1944  BP: 117/73 101/69  Pulse: 90 92  Resp: 16 18  Temp: 97.7 F (36.5 C) 97.6 F (36.4 C)  SpO2: 94% 95%   Filed Weights   07/19/20 1755  Weight: 72.6 kg    Physical Exam Constitutional:      Comments: Cachectic appearing male patient.  He is accompanied by sister.  HENT:     Head: Normocephalic and atraumatic.     Mouth/Throat:     Pharynx: No oropharyngeal exudate.  Eyes:     Pupils: Pupils are equal, round, and reactive to light.  Neck:     Comments: Positive for lymphadenopathy bilateral underarms. Cardiovascular:     Rate and Rhythm: Normal rate and regular rhythm.  Pulmonary:     Effort: No respiratory distress.     Breath sounds: No wheezing.     Comments: Decreased air entry bilaterally at bases. Abdominal:     General: Bowel sounds are normal. There is no distension.     Palpations: Abdomen is soft. There is no mass.     Tenderness: There is no abdominal tenderness. There is no guarding or rebound.  Musculoskeletal:        General: No tenderness. Normal range of motion.     Cervical back: Normal range of motion and neck supple.  Skin:    General: Skin is warm.  Neurological:     Mental Status: He is alert and oriented  to person, place, and time.  Psychiatric:        Mood and Affect: Affect normal.      LABORATORY DATA:  I have reviewed the data as listed Lab Results  Component Value Date   WBC 2.2 (L) 07/20/2020   HGB 10.7 (L) 07/20/2020   HCT 31.2 (L) 07/20/2020   MCV 88.6 07/20/2020   PLT 83 (L) 07/20/2020   Recent Labs    07/19/20 1751 07/20/20 0607 07/20/20 1118  NA 129* 133*  --   K 3.9 3.9  --   CL 93* 100  --   CO2  22 23  --   GLUCOSE 95 82  --   BUN 61* 56*  --   CREATININE 1.65* 1.41*  --   CALCIUM 13.8* 12.9*  --   GFRNONAA 45* 54*  --   PROT  --   --  5.0*  ALBUMIN  --   --  3.0*  AST  --   --  70*  ALT  --   --  22  ALKPHOS  --   --  65  BILITOT  --   --  1.6*  BILIDIR  --   --  0.5*  IBILI  --   --  1.1*    RADIOGRAPHIC STUDIES: I have personally reviewed the radiological images as listed and agreed with the findings in the report. CT ABDOMEN PELVIS WO CONTRAST  Result Date: 07/20/2020 CLINICAL DATA:  70 year old male with history of lymphadenopathy. EXAM: CT ABDOMEN AND PELVIS WITHOUT CONTRAST TECHNIQUE: Multidetector CT imaging of the abdomen and pelvis was performed following the standard protocol without IV contrast. COMPARISON:  No priors. FINDINGS: Lower chest: 1.6 x 1.2 cm left lower lobe pulmonary nodule (axial image 3 of series 4). Atherosclerotic calcifications in the descending thoracic aorta as well as the left main, left anterior descending and right coronary arteries. Mild calcifications of the aortic valve. Hepatobiliary: Low-attenuation lesions are noted in the liver, incompletely characterized on today's non-contrast CT examination, but statistically likely to represent cysts, largest of which is in segment 4A (axial image 19 of series 2) measuring 4.0 x 2.6 cm. Amorphous intermediate attenuation material lying dependently in the gallbladder, likely to represent biliary sludge. Gallbladder is nearly decompressed. Pancreas: No definite pancreatic mass or  peripancreatic fluid collections or inflammatory changes are noted on today's noncontrast CT examination. Spleen: Spleen is enlarged measuring 15.3 x 6.4 x 13.8 cm (estimated splenic volume of 665 mL) . Adrenals/Urinary Tract: 3 mm nonobstructive calculus in the interpolar collecting system of the left kidney. No additional calculi are noted within the right renal collecting system, along the course of either ureter, or within the lumen of the urinary bladder. No hydroureteronephrosis. Unenhanced appearance of the kidneys is otherwise unremarkable. Bilateral adrenal glands are normal in appearance. Urinary bladder is normal in appearance. Stomach/Bowel: Unenhanced appearance of the stomach is normal. No pathologic dilatation of small bowel or colon. The appendix is not confidently identified and may be surgically absent. Regardless, there are no inflammatory changes noted adjacent to the cecum to suggest the presence of an acute appendicitis at this time. Vascular/Lymphatic: Aortic atherosclerosis. Multiple enlarged retroperitoneal lymph nodes, largest of which is in the left para-aortic nodal station adjacent to the left renal hilum (axial image 30 of series 3) measuring 1.8 x 1.5 cm. Reproductive: Prostate gland and seminal vesicles are unremarkable in appearance. Other: No significant volume of ascites.  No pneumoperitoneum. Musculoskeletal: There are no aggressive appearing lytic or blastic lesions noted in the visualized portions of the skeleton. Chronic appearing compression fracture of T12 with 25% loss of anterior vertebral body height. IMPRESSION: 1. Extensive retroperitoneal lymphadenopathy. There is also splenomegaly. Clinical correlation for signs and symptoms of lymphoproliferative disorder is recommended. 2. 1.6 x 1.2 cm left lower lobe pulmonary nodule concerning for neoplasm. Consider one of the following in 3 months for both low-risk and high-risk individuals: (a) repeat chest CT or (b) follow-up  PET-CT. This recommendation follows the consensus statement: Guidelines for Management of Incidental Pulmonary Nodules Detected on CT Images: From the Fleischner Society 2017; Radiology 2017; 284:228-243. 3.  Probable biliary sludge lying dependently in the gallbladder. 4. Aortic atherosclerosis. 5. Additional incidental findings, as above. Electronically Signed   By: Vinnie Langton M.D.   On: 07/20/2020 15:23   CT Chest Wo Contrast  Result Date: 07/19/2020 CLINICAL DATA:  Dyspnea. Interstitial lung disease suspected. Altered mental status over the last couple of days. EXAM: CT CHEST WITHOUT CONTRAST TECHNIQUE: Multidetector CT imaging of the chest was performed following the standard protocol without IV contrast. COMPARISON:  Current and prior chest radiographs. FINDINGS: Cardiovascular: Heart normal in size. Coronary artery calcifications. Aortic atherosclerosis. Mediastinum/Nodes: Enlarged right axillary lymph nodes, largest measuring 1.3 cm in short axis. There are shoddy subcentimeter neck base lymph nodes. There are several prominent to mildly enlarged mediastinal lymph nodes. Azygos level right paratracheal node measures 1.3 cm in short axis. Subcarinal node measures 1.8 cm in short axis. Probable mildly enlarged inferior right hilar lymph node, 1.3 cm in short axis. Trachea and esophagus are unremarkable. Lungs/Pleura: Lungs demonstrate interstitial thickening peripheral cystic spaces, with a predominance in the mid to upper lungs. There is an ill-defined 1.1 cm nodular opacity in the left lower lobe, image 91, series 3. No convincing pneumonia. No pulmonary edema. No pleural effusion or pneumothorax. Upper Abdomen: Enlarged spleen, 15 cm in greatest transverse dimension. Prominent to mildly enlarged lymph nodes in the upper abdomen, in the visualized retroperitoneum and along the Peri celiac chain. Low-density liver lesions consistent with cysts. Musculoskeletal: Skeletal structures are demineralized.  There multiple vertebral compression fractures, T4, T8, T9 as well as L1, which appear chronic. No bone lesions. IMPRESSION: 1. Interstitial lung disease reflected by peripheral cystic change, interstitial thickening, areas of honeycombing and architectural distortion, with a mid to upper lung predominance. 2. 1.1 cm ill-defined nodular opacity in the left lower lobe. This could be inflammatory. Neoplastic disease is possible. Consider one of the following in 3 months for both low-risk and high-risk individuals: (a) repeat chest CT, (b) follow-up PET-CT, or (c) tissue sampling. This recommendation follows the consensus statement: Guidelines for Management of Incidental Pulmonary Nodules Detected on CT Images: From the Fleischner Society 2017; Radiology 2017; 284:228-243. No other findings in the lungs to suggest active inflammation or infection. No pulmonary edema. 3. Enlarged lymph nodes, most apparent in the right axilla, milder adenopathy along the mediastinum and right hilum, with adenopathy also noted in the visualized upper abdomen. There is also splenomegaly. The combination of findings is concerning for a lymphoproliferative disorder including lymphoma. 4. Coronary artery calcifications and aortic atherosclerosis. Aortic Atherosclerosis (ICD10-I70.0). Electronically Signed   By: Lajean Manes M.D.   On: 07/19/2020 21:07   DG Chest Portable 1 View  Result Date: 07/19/2020 CLINICAL DATA:  Weakness. EXAM: PORTABLE CHEST 1 VIEW COMPARISON:  02/10/2011. FINDINGS: Cardiac silhouette is normal in size. Normal mediastinal and hilar contours. Lungs show bilateral interstitial thickening similar to the prior exam allowing for differences in radiographic technique. No lung consolidation. No pleural effusion or pneumothorax. Skeletal structures are grossly intact. IMPRESSION: 1. No acute cardiopulmonary disease. 2. Chronic bilateral interstitial thickening. Electronically Signed   By: Lajean Manes M.D.   On:  07/19/2020 19:56    Hypercalcemia #70 year old male patient history of smoking is currently in the hospital for generalized weakness/noted to have hypercalcemia   # Hypercalcemia: Concerning for malignancy-see below.   # Pancytopenia/splenomegaly/generalized lymphadenopathy-concerning for lymphoproliferative disorder.  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 Recommend l ultrasound-guided core biopsy of the right axillary lymph node.  Patient will also likely need a bone marrow biopsy if lymph node biopsy inconclusive.  PET scan outpatient.  #Regards hypercalcemia-check vitamin D levels; parathyroid related hormone.  Calcitonin subcu twice daily; zoledronic acid 4 mg.  Aggressive IV hydration  #With regards to left lower lobe lung nodule patient will need outpatient follow-up/PET scan as above  Thank you Dr. Sabino Gasser for allowing me to participate in the care of your pleasant patient. Please do not hesitate to contact me with questions or concerns in the interim.  Discussed with Dr. Sabino Gasser.   All questions were answered. The patient knows to call the clinic with any problems, questions or concerns.    Cammie Sickle, MD 07/20/2020 10:08 PM

## 2020-07-20 NOTE — Assessment & Plan Note (Addendum)
-  70 year old male patient history of smoking is currently in the hospital for generalized weakness/noted to have hypercalcemia   # Hypercalcemia: Concerning for malignancy-[see below]-calcium improved to 10.2 [albumin]. s/p bisphosphonate on 5/30.   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 Out pt follow up.  #From medical oncology standpoint, patient can be discharged if medically stable.  We will plan outpatient follow-up/review results of the biopsy results.  Discussed with patient's sister by the bedside.

## 2020-07-21 ENCOUNTER — Inpatient Hospital Stay: Payer: Medicare Other

## 2020-07-21 DIAGNOSIS — R918 Other nonspecific abnormal finding of lung field: Secondary | ICD-10-CM | POA: Diagnosis not present

## 2020-07-21 LAB — CBC WITH DIFFERENTIAL/PLATELET
Abs Immature Granulocytes: 0.03 10*3/uL (ref 0.00–0.07)
Basophils Absolute: 0 10*3/uL (ref 0.0–0.1)
Basophils Relative: 1 %
Eosinophils Absolute: 0 10*3/uL (ref 0.0–0.5)
Eosinophils Relative: 0 %
HCT: 26 % — ABNORMAL LOW (ref 39.0–52.0)
Hemoglobin: 8.9 g/dL — ABNORMAL LOW (ref 13.0–17.0)
Immature Granulocytes: 2 %
Lymphocytes Relative: 16 %
Lymphs Abs: 0.3 10*3/uL — ABNORMAL LOW (ref 0.7–4.0)
MCH: 30.7 pg (ref 26.0–34.0)
MCHC: 34.2 g/dL (ref 30.0–36.0)
MCV: 89.7 fL (ref 80.0–100.0)
Monocytes Absolute: 0.1 10*3/uL (ref 0.1–1.0)
Monocytes Relative: 7 %
Neutro Abs: 1.5 10*3/uL — ABNORMAL LOW (ref 1.7–7.7)
Neutrophils Relative %: 74 %
Platelets: 71 10*3/uL — ABNORMAL LOW (ref 150–400)
RBC: 2.9 MIL/uL — ABNORMAL LOW (ref 4.22–5.81)
RDW: 20.1 % — ABNORMAL HIGH (ref 11.5–15.5)
WBC: 2 10*3/uL — ABNORMAL LOW (ref 4.0–10.5)
nRBC: 0 % (ref 0.0–0.2)

## 2020-07-21 LAB — ANGIOTENSIN CONVERTING ENZYME: Angiotensin-Converting Enzyme: 165 U/L — ABNORMAL HIGH (ref 14–82)

## 2020-07-21 LAB — COMPREHENSIVE METABOLIC PANEL
ALT: 22 U/L (ref 0–44)
AST: 61 U/L — ABNORMAL HIGH (ref 15–41)
Albumin: 2.6 g/dL — ABNORMAL LOW (ref 3.5–5.0)
Alkaline Phosphatase: 82 U/L (ref 38–126)
Anion gap: 10 (ref 5–15)
BUN: 43 mg/dL — ABNORMAL HIGH (ref 8–23)
CO2: 26 mmol/L (ref 22–32)
Calcium: 11.6 mg/dL — ABNORMAL HIGH (ref 8.9–10.3)
Chloride: 100 mmol/L (ref 98–111)
Creatinine, Ser: 1.29 mg/dL — ABNORMAL HIGH (ref 0.61–1.24)
GFR, Estimated: >60 mL/min (ref >60)
Glucose, Bld: 82 mg/dL (ref 70–99)
Potassium: 3.6 mmol/L (ref 3.5–5.1)
Sodium: 136 mmol/L (ref 135–145)
Total Bilirubin: 1.3 mg/dL — ABNORMAL HIGH (ref 0.3–1.2)
Total Protein: 4.4 g/dL — ABNORMAL LOW (ref 6.5–8.1)

## 2020-07-21 LAB — VITAMIN D 25 HYDROXY (VIT D DEFICIENCY, FRACTURES): Vit D, 25-Hydroxy: 8.51 ng/mL — ABNORMAL LOW (ref 30–100)

## 2020-07-21 LAB — MAGNESIUM: Magnesium: 1.5 mg/dL — ABNORMAL LOW (ref 1.7–2.4)

## 2020-07-21 LAB — HIV ANTIBODY (ROUTINE TESTING W REFLEX): HIV Screen 4th Generation wRfx: NONREACTIVE

## 2020-07-21 MED ORDER — FENTANYL CITRATE (PF) 100 MCG/2ML IJ SOLN
INTRAMUSCULAR | Status: AC
  Filled 2020-07-21: qty 2

## 2020-07-21 MED ORDER — MIDAZOLAM HCL 2 MG/2ML IJ SOLN
INTRAMUSCULAR | Status: AC
  Filled 2020-07-21: qty 2

## 2020-07-21 MED ORDER — FENTANYL CITRATE (PF) 100 MCG/2ML IJ SOLN
INTRAMUSCULAR | Status: AC | PRN
  Administered 2020-07-21: 25 ug via INTRAVENOUS

## 2020-07-21 MED ORDER — VITAMIN D 25 MCG (1000 UNIT) PO TABS
1000.0000 [IU] | ORAL_TABLET | Freq: Every day | ORAL | Status: DC
  Administered 2020-07-21 – 2020-07-25 (×5): 1000 [IU] via ORAL
  Filled 2020-07-21 (×5): qty 1

## 2020-07-21 MED ORDER — ENSURE ENLIVE PO LIQD
237.0000 mL | Freq: Two times a day (BID) | ORAL | Status: DC
  Administered 2020-07-22 – 2020-07-25 (×5): 237 mL via ORAL

## 2020-07-21 MED ORDER — MAGNESIUM SULFATE 2 GM/50ML IV SOLN
2.0000 g | Freq: Once | INTRAVENOUS | Status: AC
  Administered 2020-07-21: 2 g via INTRAVENOUS
  Filled 2020-07-21: qty 50

## 2020-07-21 MED ORDER — MIDAZOLAM HCL 2 MG/2ML IJ SOLN
INTRAMUSCULAR | Status: AC | PRN
  Administered 2020-07-21: 0.5 mg via INTRAVENOUS

## 2020-07-21 NOTE — Progress Notes (Signed)
PT Cancellation Note  Patient Details Name: Patrick Shepard MRN: [id number] DOB: [birth year]   Cancelled Treatment:    Reason Eval/Treat Not Completed: Patient declined, no reason specified. Patient does not feel up to it right now, would like to wait until tomorrow. Will return tomorrow for PT evaluation.      Janett Kamath 07/21/2020, 1:18 PM

## 2020-07-21 NOTE — Progress Notes (Signed)
Initial Nutrition Assessment  DOCUMENTATION CODES:  Not applicable  INTERVENTION:   Continue current diet as ordered, encouraged PO intake  1000 IU of vitamin d daily for deficiency  Ensure Enlive po BID, each supplement provides 350 kcal and 20 grams of protein  Request new measured weight  NUTRITION DIAGNOSIS:  Increased nutrient needs related to cancer and cancer related treatments as evidenced by estimated needs.  GOAL:  Patient will meet greater than or equal to 90% of their needs  MONITOR:  PO intake,Supplement acceptance,Labs,Weight trends  REASON FOR ASSESSMENT:  Malnutrition Screening Tool    ASSESSMENT:  Pt presented to ED from facility with weakness. Found to be hypotensive on arrival to ED. Imaging revealed a left lower lobe lung nodule. Family reports no medical care in ~15 years, no PMH noted in chart.  Oncology consulting on pt after lung nodule and mediastinal lymphadenopathy seen on CT at admission. Malignancy is suspected. Pt underwent ultrasound guided right axillary lymph node biopsy 5/31 to send for pathology report. Family reports significant weight loss of ~30 lb since Christmas.   Pt sleeping at the time of visit, did not wake to name being called or to IV beeping. Will defer nutrition interview at this time, did not wake pt as procedure was performed earlier today. Visibly thin on inspection, will add nutrition supplements and follow-up on intake trends.    Average Meal Intake: . 5/29-5/31: 77% intake x 3 recorded meals  Nutritionally Relevant Medications Scheduled Meds: . calcitonin  4 Units/kg Subcutaneous BID   PRN Meds: ondansetron, promethazine  Labs reviewed:  BUN 43, creatinine 1.29  Calcium corrects to 12.72 for low albumin, elevated  Mg 1.5  Vitamin D 8.51 (5/30)  NUTRITION - FOCUSED PHYSICAL EXAM: Defer to follow-up, pt sleeping  Diet Order:           Diet regular Room service appropriate? Yes; Fluid consistency: Thin  Diet  effective now            EDUCATION NEEDS:  No education needs have been identified at this time  Skin:  Skin Assessment: Reviewed RN Assessment  Last BM:  5/31 - type 2, per RN documentation  Height:  Ht Readings from Last 1 Encounters:  07/19/20 5\' 8"  (1.727 m)    Weight:  Wt Readings from Last 1 Encounters:  07/19/20 72.6 kg    Ideal Body Weight:  70 kg  BMI:  Body mass index is 24.33 kg/m.  Estimated Nutritional Needs:   Kcal:  2000-2200 kcal/d  Protein:  100-120g/d  Fluid:  >2L/d   Ranell Patrick, RD, LDN Clinical Dietitian Pager on [place]

## 2020-07-21 NOTE — Progress Notes (Signed)
[place]   DOB:04-07-1950   LD#:[id number]    Subjective: Patient denies any acute events overnight.  Complains of nausea this morning/gagging after a throat swab.  Objective:  Vitals:   07/21/20 1522 07/21/20 2026  BP: 100/65 105/60  Pulse: 90 79  Resp:  18  Temp: 97.9 F (36.6 C) 97.7 F (36.5 C)  SpO2: 97% 96%     Intake/Output Summary (Last 24 hours) at 07/21/2020 2134 Last data filed at 07/21/2020 1830 Gross per 24 hour  Intake 2222.72 ml  Output 2300 ml  Net -77.28 ml    Physical Exam HENT:     Head: Normocephalic and atraumatic.     Mouth/Throat:     Pharynx: No oropharyngeal exudate.  Eyes:     Pupils: Pupils are equal, round, and reactive to light.  Neck:     Comments: Bilateral axillary adenopathy right for the left.  Shortly 1 to 2 cm lymph node present in the posterior right neck.  Cardiovascular:     Rate and Rhythm: Normal rate and regular rhythm.  Pulmonary:     Effort: No respiratory distress.     Breath sounds: No wheezing.     Comments: Decreased air entry bilaterally.  No wheeze or crackles Abdominal:     General: Bowel sounds are normal. There is no distension.     Palpations: Abdomen is soft. There is no mass.     Tenderness: There is no abdominal tenderness. There is no guarding or rebound.  Musculoskeletal:        General: No tenderness. Normal range of motion.     Cervical back: Normal range of motion and neck supple.  Lymphadenopathy:     Cervical: Cervical adenopathy present.  Skin:    General: Skin is warm.  Neurological:     Mental Status: He is alert and oriented to person, place, and time.  Psychiatric:        Mood and Affect: Affect normal.      Labs:  Lab Results  Component Value Date   WBC 2.0 (L) 07/21/2020   HGB 8.9 (L) 07/21/2020   HCT 26.0 (L) 07/21/2020   MCV 89.7 07/21/2020   PLT 71 (L) 07/21/2020   NEUTROABS 1.5 (L) 07/21/2020    Lab Results  Component Value Date   NA 136 07/21/2020   K 3.6 07/21/2020    CL 100 07/21/2020   CO2 26 07/21/2020    Studies:  CT ABDOMEN PELVIS WO CONTRAST  Result Date: 07/20/2020 CLINICAL DATA:  70 year old male with history of lymphadenopathy. EXAM: CT ABDOMEN AND PELVIS WITHOUT CONTRAST TECHNIQUE: Multidetector CT imaging of the abdomen and pelvis was performed following the standard protocol without IV contrast. COMPARISON:  No priors. FINDINGS: Lower chest: 1.6 x 1.2 cm left lower lobe pulmonary nodule (axial image 3 of series 4). Atherosclerotic calcifications in the descending thoracic aorta as well as the left main, left anterior descending and right coronary arteries. Mild calcifications of the aortic valve. Hepatobiliary: Low-attenuation lesions are noted in the liver, incompletely characterized on today's non-contrast CT examination, but statistically likely to represent cysts, largest of which is in segment 4A (axial image 19 of series 2) measuring 4.0 x 2.6 cm. Amorphous intermediate attenuation material lying dependently in the gallbladder, likely to represent biliary sludge. Gallbladder is nearly decompressed. Pancreas: No definite pancreatic mass or peripancreatic fluid collections or inflammatory changes are noted on today's noncontrast CT examination. Spleen: Spleen is enlarged measuring 15.3 x 6.4 x 13.8 cm (estimated splenic  volume of 665 mL) . Adrenals/Urinary Tract: 3 mm nonobstructive calculus in the interpolar collecting system of the left kidney. No additional calculi are noted within the right renal collecting system, along the course of either ureter, or within the lumen of the urinary bladder. No hydroureteronephrosis. Unenhanced appearance of the kidneys is otherwise unremarkable. Bilateral adrenal glands are normal in appearance. Urinary bladder is normal in appearance. Stomach/Bowel: Unenhanced appearance of the stomach is normal. No pathologic dilatation of small bowel or colon. The appendix is not confidently identified and may be surgically  absent. Regardless, there are no inflammatory changes noted adjacent to the cecum to suggest the presence of an acute appendicitis at this time. Vascular/Lymphatic: Aortic atherosclerosis. Multiple enlarged retroperitoneal lymph nodes, largest of which is in the left para-aortic nodal station adjacent to the left renal hilum (axial image 30 of series 3) measuring 1.8 x 1.5 cm. Reproductive: Prostate gland and seminal vesicles are unremarkable in appearance. Other: No significant volume of ascites.  No pneumoperitoneum. Musculoskeletal: There are no aggressive appearing lytic or blastic lesions noted in the visualized portions of the skeleton. Chronic appearing compression fracture of T12 with 25% loss of anterior vertebral body height. IMPRESSION: 1. Extensive retroperitoneal lymphadenopathy. There is also splenomegaly. Clinical correlation for signs and symptoms of lymphoproliferative disorder is recommended. 2. 1.6 x 1.2 cm left lower lobe pulmonary nodule concerning for neoplasm. Consider one of the following in 3 months for both low-risk and high-risk individuals: (a) repeat chest CT or (b) follow-up PET-CT. This recommendation follows the consensus statement: Guidelines for Management of Incidental Pulmonary Nodules Detected on CT Images: From the Fleischner Society 2017; Radiology 2017; 284:228-243. 3. Probable biliary sludge lying dependently in the gallbladder. 4. Aortic atherosclerosis. 5. Additional incidental findings, as above. Electronically Signed   By: Vinnie Langton M.D.   On: 07/20/2020 15:23   [place] CORE BIOPSY (LYMPH NODES)  Result Date: 07/21/2020 INDICATION: 70 year old male presenting with weight loss, left lower lobe mass, and lymphadenopathy. EXAM: ULTRASOUND GUIDED core BIOPSY OF right axillary lymph node. MEDICATIONS: None. ANESTHESIA/SEDATION: Fentanyl 25 mcg IV; Versed 0.5 mg IV Moderate Sedation Time:  7 The patient was continuously monitored during the procedure by the interventional  radiology nurse under my direct supervision. PROCEDURE: The procedure, risks, benefits, and alternatives were explained to the patient. Questions regarding the procedure were encouraged and answered. The patient understands and consents to the procedure. The right axilla was prepped with chlorhexidine in a sterile fashion, and a sterile drape was applied covering the operative field. A sterile gown and sterile gloves were used for the procedure. Preprocedure ultrasound evaluation demonstrated multiple prominent right axillary lymph nodes. The dominant node was selected for biopsy. The procedure was planned. Local anesthesia was provided with 1% Lidocaine. A small skin nick was made. Under direct ultrasound visualization, a total of 3, 18 gauge core biopsies were obtained. The samples were placed on saline soaked Telfa and sent to Pathology. Postprocedural imaging demonstrated no evidence of surrounding hematoma. Sterile bandage was placed. The patient tolerated procedure well was transferred back to floor in stable condition. COMPLICATIONS: None immediate. FINDINGS: Right axillary lymphadenopathy. IMPRESSION: Technically successful ultrasound-guided right axillary lymph node biopsy. Ruthann Cancer, MD [facility] Electronically Signed   By: Ruthann Cancer MD   On: 07/21/2020 10:49    Hypercalcemia -70 year old male patient history of smoking is currently in the hospital for generalized weakness/noted to have hypercalcemia   # Hypercalcemia: Concerning for malignancy-[see below]-calcium improved to  12; continue calcitonin s/p bisphosphonate on 5/30.   # Pancytopenia/splenomegaly/generalized lymphadenopathy-concerning for lymphoproliferative disorder-lymph node biopsy.  #Interstitial lung disease based on imaging / lung nodule ~1.1cm LLL/active smoker.    Cammie Sickle, MD 07/21/2020  9:34 PM

## 2020-07-21 NOTE — Progress Notes (Signed)
Chief Complaint: Patient was seen in consultation today for LN bx  Referring Physician(s): Dr. Rogue Bussing  Supervising Physician: Ruthann Cancer  Patient Status: [facility] - In-pt  History of Present Illness: Patrick Shepard is a 70 y.o. male admitted with progressive weakness and found to have lung nodule with adenopathy. IR is asked to perform LN bx for tissue diagnosis. PMHx, meds, labs, imaging, allergies reviewed. Feels well, no recent fevers, chills, illness. Has been NPO today as directed. Sister at bedside. Pt makes own medical decisions and consents for self at this time.    Past Medical History:  Diagnosis Date  . 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 Tobacco abuse     Past Surgical History:  Procedure Laterality Date  . KNEE SURGERY Left    for injury. Did not have knee replacement  . LAPAROTOMY     done at age 60 for intestinal issues    Allergies: Patient has no known allergies.  Medications:  Current Facility-Administered Medications:  .  0.9 %  sodium chloride infusion, , Intravenous, Continuous, Dwyane Dee, MD, Last Rate: 150 mL/hr at 07/21/20 0637, New Bag at 07/21/20 0102 .  acetaminophen (TYLENOL) tablet 650 mg, 650 mg, Oral, Q6H PRN **OR** acetaminophen (TYLENOL) suppository 650 mg, 650 mg, Rectal, Q6H PRN, Norins, Heinz Knuckles, MD .  calcitonin (MIACALCIN) injection 290 Units, 4 Units/kg, Subcutaneous, BID, Dwyane Dee, MD, 290 Units at 07/20/20 2128 .  enoxaparin (LOVENOX) injection 40 mg, 40 mg, Subcutaneous, Q24H, Norins, Heinz Knuckles, MD, 40 mg at 07/20/20 2129 .  LORazepam (ATIVAN) tablet 1 mg, 1 mg, Oral, Q6H PRN, Dwyane Dee, MD .  ondansetron [facility]) injection 4 mg, 4 mg, Intravenous, Q6H PRN, Dwyane Dee, MD, 4 mg at 07/20/20 1520 .  oxyCODONE (Oxy IR/ROXICODONE) immediate release tablet 5 mg, 5 mg, Oral, Q4H PRN, Dwyane Dee, MD .  promethazine (PHENERGAN) 12.5 mg in sodium chloride 0.9 % 50 mL IVPB,  12.5 mg, Intravenous, Q6H PRN, Dwyane Dee, MD .  traZODone (DESYREL) tablet 25 mg, 25 mg, Oral, QHS PRN, Norins, Heinz Knuckles, MD    Family History  Problem Relation Age of Onset  . COPD Mother   . Alcoholism Father   . Cirrhosis Father     Social History   Socioeconomic History  . Marital status: Single    Spouse name: Not on file  . Number of children: Not on file  . Years of education: Not on file  . Highest education level: Not on file  Occupational History  . Not on file  Tobacco Use  . Smoking status: Current Every Day Smoker    Packs/day: 1.00  . Smokeless tobacco: Former Network engineer  . Vaping Use: Never used  Substance and Sexual Activity  . Alcohol use: Not on file  . Drug use: Not on file  . Sexual activity: Not on file  Other Topics Concern  . Not on file  Social History Narrative  . Not on file   Social Determinants of Health   Financial Resource Strain: Not on file  Food Insecurity: Not on file  Transportation Needs: Not on file  Physical Activity: Not on file  Stress: Not on file  Social Connections: Not on file    Review of Systems: A 12 point ROS discussed and pertinent positives are indicated in the HPI above.  All other systems are negative.  Review of Systems  Vital Signs: BP 110/64 (BP Location: Right  Arm)   Pulse 86   Temp 97.9 F (36.6 C) (Oral)   Resp 20   Ht 5\' 8"  (1.727 m)   Wt 72.6 kg   SpO2 92%   BMI 24.33 kg/m   Physical Exam Constitutional:      Appearance: Normal appearance.  HENT:     Mouth/Throat:     Mouth: Mucous membranes are moist.     Pharynx: Oropharynx is clear.  Cardiovascular:     Rate and Rhythm: Normal rate and regular rhythm.     Heart sounds: Normal heart sounds.  Pulmonary:     Effort: Pulmonary effort is normal. No respiratory distress.     Breath sounds: Normal breath sounds.  Neurological:     General: No focal deficit present.     Mental Status: He is alert and oriented to person,  place, and time.  Psychiatric:        Mood and Affect: Mood normal.        Thought Content: Thought content normal.       Imaging: CT ABDOMEN PELVIS WO CONTRAST  Result Date: 07/20/2020 CLINICAL DATA:  70 year old male with history of lymphadenopathy. EXAM: CT ABDOMEN AND PELVIS WITHOUT CONTRAST TECHNIQUE: Multidetector CT imaging of the abdomen and pelvis was performed following the standard protocol without IV contrast. COMPARISON:  No priors. FINDINGS: Lower chest: 1.6 x 1.2 cm left lower lobe pulmonary nodule (axial image 3 of series 4). Atherosclerotic calcifications in the descending thoracic aorta as well as the left main, left anterior descending and right coronary arteries. Mild calcifications of the aortic valve. Hepatobiliary: Low-attenuation lesions are noted in the liver, incompletely characterized on today's non-contrast CT examination, but statistically likely to represent cysts, largest of which is in segment 4A (axial image 19 of series 2) measuring 4.0 x 2.6 cm. Amorphous intermediate attenuation material lying dependently in the gallbladder, likely to represent biliary sludge. Gallbladder is nearly decompressed. Pancreas: No definite pancreatic mass or peripancreatic fluid collections or inflammatory changes are noted on today's noncontrast CT examination. Spleen: Spleen is enlarged measuring 15.3 x 6.4 x 13.8 cm (estimated splenic volume of 665 mL) . Adrenals/Urinary Tract: 3 mm nonobstructive calculus in the interpolar collecting system of the left kidney. No additional calculi are noted within the right renal collecting system, along the course of either ureter, or within the lumen of the urinary bladder. No hydroureteronephrosis. Unenhanced appearance of the kidneys is otherwise unremarkable. Bilateral adrenal glands are normal in appearance. Urinary bladder is normal in appearance. Stomach/Bowel: Unenhanced appearance of the stomach is normal. No pathologic dilatation of small  bowel or colon. The appendix is not confidently identified and may be surgically absent. Regardless, there are no inflammatory changes noted adjacent to the cecum to suggest the presence of an acute appendicitis at this time. Vascular/Lymphatic: Aortic atherosclerosis. Multiple enlarged retroperitoneal lymph nodes, largest of which is in the left para-aortic nodal station adjacent to the left renal hilum (axial image 30 of series 3) measuring 1.8 x 1.5 cm. Reproductive: Prostate gland and seminal vesicles are unremarkable in appearance. Other: No significant volume of ascites.  No pneumoperitoneum. Musculoskeletal: There are no aggressive appearing lytic or blastic lesions noted in the visualized portions of the skeleton. Chronic appearing compression fracture of T12 with 25% loss of anterior vertebral body height. IMPRESSION: 1. Extensive retroperitoneal lymphadenopathy. There is also splenomegaly. Clinical correlation for signs and symptoms of lymphoproliferative disorder is recommended. 2. 1.6 x 1.2 cm left lower lobe pulmonary nodule concerning for neoplasm. Consider  one of the following in 3 months for both low-risk and high-risk individuals: (a) repeat chest CT or (b) follow-up PET-CT. This recommendation follows the consensus statement: Guidelines for Management of Incidental Pulmonary Nodules Detected on CT Images: From the Fleischner Society 2017; Radiology 2017; 284:228-243. 3. Probable biliary sludge lying dependently in the gallbladder. 4. Aortic atherosclerosis. 5. Additional incidental findings, as above. Electronically Signed   By: Vinnie Langton M.D.   On: 07/20/2020 15:23   CT Chest Wo Contrast  Result Date: 07/19/2020 CLINICAL DATA:  Dyspnea. Interstitial lung disease suspected. Altered mental status over the last couple of days. EXAM: CT CHEST WITHOUT CONTRAST TECHNIQUE: Multidetector CT imaging of the chest was performed following the standard protocol without IV contrast. COMPARISON:   Current and prior chest radiographs. FINDINGS: Cardiovascular: Heart normal in size. Coronary artery calcifications. Aortic atherosclerosis. Mediastinum/Nodes: Enlarged right axillary lymph nodes, largest measuring 1.3 cm in short axis. There are shoddy subcentimeter neck base lymph nodes. There are several prominent to mildly enlarged mediastinal lymph nodes. Azygos level right paratracheal node measures 1.3 cm in short axis. Subcarinal node measures 1.8 cm in short axis. Probable mildly enlarged inferior right hilar lymph node, 1.3 cm in short axis. Trachea and esophagus are unremarkable. Lungs/Pleura: Lungs demonstrate interstitial thickening peripheral cystic spaces, with a predominance in the mid to upper lungs. There is an ill-defined 1.1 cm nodular opacity in the left lower lobe, image 91, series 3. No convincing pneumonia. No pulmonary edema. No pleural effusion or pneumothorax. Upper Abdomen: Enlarged spleen, 15 cm in greatest transverse dimension. Prominent to mildly enlarged lymph nodes in the upper abdomen, in the visualized retroperitoneum and along the Peri celiac chain. Low-density liver lesions consistent with cysts. Musculoskeletal: Skeletal structures are demineralized. There multiple vertebral compression fractures, T4, T8, T9 as well as L1, which appear chronic. No bone lesions. IMPRESSION: 1. Interstitial lung disease reflected by peripheral cystic change, interstitial thickening, areas of honeycombing and architectural distortion, with a mid to upper lung predominance. 2. 1.1 cm ill-defined nodular opacity in the left lower lobe. This could be inflammatory. Neoplastic disease is possible. Consider one of the following in 3 months for both low-risk and high-risk individuals: (a) repeat chest CT, (b) follow-up PET-CT, or (c) tissue sampling. This recommendation follows the consensus statement: Guidelines for Management of Incidental Pulmonary Nodules Detected on CT Images: From the Fleischner  Society 2017; Radiology 2017; 284:228-243. No other findings in the lungs to suggest active inflammation or infection. No pulmonary edema. 3. Enlarged lymph nodes, most apparent in the right axilla, milder adenopathy along the mediastinum and right hilum, with adenopathy also noted in the visualized upper abdomen. There is also splenomegaly. The combination of findings is concerning for a lymphoproliferative disorder including lymphoma. 4. Coronary artery calcifications and aortic atherosclerosis. Aortic Atherosclerosis (ICD10-I70.0). Electronically Signed   By: Lajean Manes M.D.   On: 07/19/2020 21:07   DG Chest Portable 1 View  Result Date: 07/19/2020 CLINICAL DATA:  Weakness. EXAM: PORTABLE CHEST 1 VIEW COMPARISON:  02/10/2011. FINDINGS: Cardiac silhouette is normal in size. Normal mediastinal and hilar contours. Lungs show bilateral interstitial thickening similar to the prior exam allowing for differences in radiographic technique. No lung consolidation. No pleural effusion or pneumothorax. Skeletal structures are grossly intact. IMPRESSION: 1. No acute cardiopulmonary disease. 2. Chronic bilateral interstitial thickening. Electronically Signed   By: Lajean Manes M.D.   On: 07/19/2020 19:56    Labs:  CBC: Recent Labs    07/19/20 1751 07/20/20 1118 07/21/20 8466  WBC 2.6* 2.2* 2.0*  HGB 10.1* 10.7* 8.9*  HCT 29.3* 31.2* 26.0*  PLT 87* 83* 71*    COAGS: No results for input(s): INR, APTT in the last 8760 hours.  BMP: Recent Labs    07/19/20 1751 07/20/20 0607 07/21/20 0626  NA 129* 133* 136  K 3.9 3.9 3.6  CL 93* 100 100  CO2 22 23 26   GLUCOSE 95 82 82  BUN 61* 56* 43*  CALCIUM 13.8* 12.9* 11.6*  CREATININE 1.65* 1.41* 1.29*  GFRNONAA 45* 54* >60    LIVER FUNCTION TESTS: Recent Labs    07/20/20 1118 07/21/20 0626  BILITOT 1.6* 1.3*  AST 70* 61*  ALT 22 22  ALKPHOS 65 82  PROT 5.0* 4.4*  ALBUMIN 3.0* 2.6*    TUMOR MARKERS: No results for input(s): AFPTM,  CEA, CA199, CHROMGRNA in the last 8760 hours.  Assessment and Plan: Lung nodule with adenopathy Imaging reviewed, (R)axillary LN is amenable to US guided bx Labs reviewed. Risks and benefits of LN biopsy was discussed with the patient and/or patient's family including, but not limited to bleeding, infection, damage to adjacent structures or low yield requiring additional tests.  All of the questions were answered and there is agreement to proceed.  Consent signed and in chart.    Thank you for this interesting consult.  I greatly enjoyed meeting Patrick Shepard and look forward to participating in their care.  A copy of this report was sent to the requesting provider on this date.  Electronically Signed: Ascencion Dike, PA-C 07/21/2020, 9:16 AM   I spent a total of 20 minutes in face to face in clinical consultation, greater than 50% of which was counseling/coordinating care for LN bx

## 2020-07-21 NOTE — Progress Notes (Signed)
Progress Note    Patrick Shepard   TDS:[id number]  DOB: May 03, 1950  DOA: 07/19/2020     2  PCP: Pcp, No   CC: weight loss, weakness  Hospital Course: Patrick Shepard is a 70 y.o. male with PMH tobacco use who presented to the ER with worsening weakness at home.  He is an ongoing tobacco user with approximately 100 pack-year smoking history (still smoking approximately 2 packs/day).  He also has not sought any medical care since approximately 2007 due to an aversion to healthcare per his sister.  She also stated that she has not seen him since Christmas and he had lost a significant amount of weight since then, possibly 20 to 30 pounds she guesses. He underwent CT chest on admission which showed a left lower lobe opacity measuring 1.1 cm and enlarged axillary lymphadenopathy along with mediastinal lymphadenopathy. He had notable hypercalcemia on admission as well.  He was started on fluids and admitted for further work-up and treatment. Oncology was also consulted on admission.   Interval History:  No events overnight.  Sister present this morning. He underwent right axillary lymph node biopsy today with radiology. Still having very poor appetite and was dry heaving due to nausea during exam this morning.  ROS: Constitutional: negative for chills and fevers, Respiratory: negative for cough, Cardiovascular: negative for chest pain and Gastrointestinal: negative for abdominal pain  Assessment & Plan:  Lung lesion - Left lower lobe opacity measuring 1.1 cm with associated axillary and mediastinal lymphadenopathy seen on CT chest.  Given his longstanding smoking history, concern would be for underlying malignancy.  There is also hypercalcemia present on admission - Oncology consulted for assistance with further work-up - Follow-up CT abdomen/pelvis for further staging: Extensive retroperitoneal lymphadenopathy, splenomegaly.  Hypercalcemia  - Calcium 13.8 on admission but no  albumin to calculate corrected calcium however this morning his albumin is 3 so likely had pretty significant hypercalcemia on admission with correction - Will give calcitonin and zoledronic acid; ordered on 07/20/2020 - Follow-up repeat calcium levels; corrected calcium still elevated but downtrending -Continue fluids and calcitonin - Follow-up repeat calcium levels in a.m.  Pancytopenia - suspected due to underlying malignancy as noted above  Tobacco use - patient states started smoking approx 70 years old. Has approx 100 pack year history - cessation was recommended strongly.  - nicotine patch as needed  Old records reviewed in assessment of this patient  Antimicrobials:   DVT prophylaxis: enoxaparin (LOVENOX) injection 40 mg Start: 07/19/20 2200   Code Status:   Code Status: Full Code Family Communication: sister  Disposition Plan: Status is: Inpatient  Remains inpatient appropriate because:Ongoing diagnostic testing needed not appropriate for outpatient work up, IV treatments appropriate due to intensity of illness or inability to take PO and Inpatient level of care appropriate due to severity of illness   Dispo: The patient is from: Home              Anticipated d/c is to: Home              Patient currently is not medically stable to d/c.   Difficult to place patient No  Risk of unplanned readmission score: Unplanned Admission- Pilot do not use: 13.48   Objective: Blood pressure 97/76, pulse 92, temperature 98.1 F (36.7 C), temperature source Oral, resp. rate 16, height 5\' 8"  (1.727 m), weight 72.6 kg, SpO2 99 %.  Examination: General appearance: Cachectic adult man laying in bed appearing older than stated age  but is comfortable Head: Normocephalic, without obvious abnormality, atraumatic Eyes: EOMI Lungs: clear to auscultation bilaterally Heart: regular rate and rhythm and S1, S2 normal Abdomen: Prominently exposed ribs due to cachexia.  Thin, nontender, bowel  sounds present Extremities: No edema Skin: mobility and turgor normal Neurologic: Grossly normal  Consultants:   Oncology  Procedures:   Right axillary LN biopsy, 07/21/20  Data Reviewed: I have personally reviewed following labs and imaging studies Results for orders placed or performed during the hospital encounter of 07/19/20 (from the past 24 hour(s))  CBC with Differential/Platelet     Status: Abnormal   Collection Time: 07/21/20  6:26 AM  Result Value Ref Range   WBC 2.0 (L) 4.0 - 10.5 K/uL   RBC 2.90 (L) 4.22 - 5.81 MIL/uL   Hemoglobin 8.9 (L) 13.0 - 17.0 g/dL   HCT 26.0 (L) 39.0 - 52.0 %   MCV 89.7 80.0 - 100.0 fL   MCH 30.7 26.0 - 34.0 pg   MCHC 34.2 30.0 - 36.0 g/dL   RDW 20.1 (H) 11.5 - 15.5 %   Platelets 71 (L) 150 - 400 K/uL   nRBC 0.0 0.0 - 0.2 %   Neutrophils Relative % 74 %   Neutro Abs 1.5 (L) 1.7 - 7.7 K/uL   Lymphocytes Relative 16 %   Lymphs Abs 0.3 (L) 0.7 - 4.0 K/uL   Monocytes Relative 7 %   Monocytes Absolute 0.1 0.1 - 1.0 K/uL   Eosinophils Relative 0 %   Eosinophils Absolute 0.0 0.0 - 0.5 K/uL   Basophils Relative 1 %   Basophils Absolute 0.0 0.0 - 0.1 K/uL   Immature Granulocytes 2 %   Abs Immature Granulocytes 0.03 0.00 - 0.07 K/uL  Magnesium     Status: Abnormal   Collection Time: 07/21/20  6:26 AM  Result Value Ref Range   Magnesium 1.5 (L) 1.7 - 2.4 mg/dL  Comprehensive metabolic panel     Status: Abnormal   Collection Time: 07/21/20  6:26 AM  Result Value Ref Range   Sodium 136 135 - 145 mmol/L   Potassium 3.6 3.5 - 5.1 mmol/L   Chloride 100 98 - 111 mmol/L   CO2 26 22 - 32 mmol/L   Glucose, Bld 82 70 - 99 mg/dL   BUN 43 (H) 8 - 23 mg/dL   Creatinine, Ser 1.29 (H) 0.61 - 1.24 mg/dL   Calcium 11.6 (H) 8.9 - 10.3 mg/dL   Total Protein 4.4 (L) 6.5 - 8.1 g/dL   Albumin 2.6 (L) 3.5 - 5.0 g/dL   AST 61 (H) 15 - 41 U/L   ALT 22 0 - 44 U/L   Alkaline Phosphatase 82 38 - 126 U/L   Total Bilirubin 1.3 (H) 0.3 - 1.2 mg/dL   GFR,  Estimated >60 >60 mL/min   Anion gap 10 5 - 15    Recent Results (from the past 240 hour(s))  Resp Panel by RT-PCR (Flu A&B, Covid) Nasopharyngeal Swab     Status: None   Collection Time: 07/19/20  6:26 PM   Specimen: Nasopharyngeal Swab; Nasopharyngeal(NP) swabs in vial transport medium  Result Value Ref Range Status   SARS Coronavirus 2 by RT PCR NEGATIVE NEGATIVE Final    Comment: (NOTE) SARS-CoV-2 target nucleic acids are NOT DETECTED.  The SARS-CoV-2 RNA is generally detectable in upper respiratory specimens during the acute phase of infection. The lowest concentration of SARS-CoV-2 viral copies this assay can detect is 138 copies/mL. A negative result does not preclude  SARS-Cov-2 infection and should not be used as the sole basis for treatment or other patient management decisions. A negative result may occur with  improper specimen collection/handling, submission of specimen other than nasopharyngeal swab, presence of viral mutation(s) within the areas targeted by this assay, and inadequate number of viral copies(<138 copies/mL). A negative result must be combined with clinical observations, patient history, and epidemiological information. The expected result is Negative.  Fact Sheet for Patients:  EntrepreneurPulse.com.au  Fact Sheet for Healthcare Providers:  IncredibleEmployment.be  This test is no t yet approved or cleared by the [place] FDA and  has been authorized for detection and/or diagnosis of SARS-CoV-2 by FDA under an Emergency Use Authorization (EUA). This EUA will remain  in effect (meaning this test can be used) for the duration of the COVID-19 declaration under Section 564(b)(1) of the Act, 21 U.S.C.section 360bbb-3(b)(1), unless the authorization is terminated  or revoked sooner.       Influenza A by PCR NEGATIVE NEGATIVE Final   Influenza B by PCR NEGATIVE NEGATIVE Final    Comment: (NOTE) The Xpert Xpress  SARS-CoV-2/FLU/RSV plus assay is intended as an aid in the diagnosis of influenza from Nasopharyngeal swab specimens and should not be used as a sole basis for treatment. Nasal washings and aspirates are unacceptable for Xpert Xpress SARS-CoV-2/FLU/RSV testing.  Fact Sheet for Patients: EntrepreneurPulse.com.au  Fact Sheet for Healthcare Providers: IncredibleEmployment.be  This test is not yet approved or cleared by the [place] FDA and has been authorized for detection and/or diagnosis of SARS-CoV-2 by FDA under an Emergency Use Authorization (EUA). This EUA will remain in effect (meaning this test can be used) for the duration of the COVID-19 declaration under Section 564(b)(1) of the Act, 21 U.S.C. section 360bbb-3(b)(1), unless the authorization is terminated or revoked.  Performed at [facility], [address] [place], [place] [address]      Radiology Studies: CT ABDOMEN PELVIS WO CONTRAST  Result Date: 07/20/2020 CLINICAL DATA:  70 year old male with history of lymphadenopathy. EXAM: CT ABDOMEN AND PELVIS WITHOUT CONTRAST TECHNIQUE: Multidetector CT imaging of the abdomen and pelvis was performed following the standard protocol without IV contrast. COMPARISON:  No priors. FINDINGS: Lower chest: 1.6 x 1.2 cm left lower lobe pulmonary nodule (axial image 3 of series 4). Atherosclerotic calcifications in the descending thoracic aorta as well as the left main, left anterior descending and right coronary arteries. Mild calcifications of the aortic valve. Hepatobiliary: Low-attenuation lesions are noted in the liver, incompletely characterized on today's non-contrast CT examination, but statistically likely to represent cysts, largest of which is in segment 4A (axial image 19 of series 2) measuring 4.0 x 2.6 cm. Amorphous intermediate attenuation material lying dependently in the gallbladder, likely to represent biliary sludge.  Gallbladder is nearly decompressed. Pancreas: No definite pancreatic mass or peripancreatic fluid collections or inflammatory changes are noted on today's noncontrast CT examination. Spleen: Spleen is enlarged measuring 15.3 x 6.4 x 13.8 cm (estimated splenic volume of 665 mL) . Adrenals/Urinary Tract: 3 mm nonobstructive calculus in the interpolar collecting system of the left kidney. No additional calculi are noted within the right renal collecting system, along the course of either ureter, or within the lumen of the urinary bladder. No hydroureteronephrosis. Unenhanced appearance of the kidneys is otherwise unremarkable. Bilateral adrenal glands are normal in appearance. Urinary bladder is normal in appearance. Stomach/Bowel: Unenhanced appearance of the stomach is normal. No pathologic dilatation of small bowel or colon. The appendix is not confidently  identified and may be surgically absent. Regardless, there are no inflammatory changes noted adjacent to the cecum to suggest the presence of an acute appendicitis at this time. Vascular/Lymphatic: Aortic atherosclerosis. Multiple enlarged retroperitoneal lymph nodes, largest of which is in the left para-aortic nodal station adjacent to the left renal hilum (axial image 30 of series 3) measuring 1.8 x 1.5 cm. Reproductive: Prostate gland and seminal vesicles are unremarkable in appearance. Other: No significant volume of ascites.  No pneumoperitoneum. Musculoskeletal: There are no aggressive appearing lytic or blastic lesions noted in the visualized portions of the skeleton. Chronic appearing compression fracture of T12 with 25% loss of anterior vertebral body height. IMPRESSION: 1. Extensive retroperitoneal lymphadenopathy. There is also splenomegaly. Clinical correlation for signs and symptoms of lymphoproliferative disorder is recommended. 2. 1.6 x 1.2 cm left lower lobe pulmonary nodule concerning for neoplasm. Consider one of the following in 3 months for  both low-risk and high-risk individuals: (a) repeat chest CT or (b) follow-up PET-CT. This recommendation follows the consensus statement: Guidelines for Management of Incidental Pulmonary Nodules Detected on CT Images: From the Fleischner Society 2017; Radiology 2017; 284:228-243. 3. Probable biliary sludge lying dependently in the gallbladder. 4. Aortic atherosclerosis. 5. Additional incidental findings, as above. Electronically Signed   By: Vinnie Langton M.D.   On: 07/20/2020 15:23   CT Chest Wo Contrast  Result Date: 07/19/2020 CLINICAL DATA:  Dyspnea. Interstitial lung disease suspected. Altered mental status over the last couple of days. EXAM: CT CHEST WITHOUT CONTRAST TECHNIQUE: Multidetector CT imaging of the chest was performed following the standard protocol without IV contrast. COMPARISON:  Current and prior chest radiographs. FINDINGS: Cardiovascular: Heart normal in size. Coronary artery calcifications. Aortic atherosclerosis. Mediastinum/Nodes: Enlarged right axillary lymph nodes, largest measuring 1.3 cm in short axis. There are shoddy subcentimeter neck base lymph nodes. There are several prominent to mildly enlarged mediastinal lymph nodes. Azygos level right paratracheal node measures 1.3 cm in short axis. Subcarinal node measures 1.8 cm in short axis. Probable mildly enlarged inferior right hilar lymph node, 1.3 cm in short axis. Trachea and esophagus are unremarkable. Lungs/Pleura: Lungs demonstrate interstitial thickening peripheral cystic spaces, with a predominance in the mid to upper lungs. There is an ill-defined 1.1 cm nodular opacity in the left lower lobe, image 91, series 3. No convincing pneumonia. No pulmonary edema. No pleural effusion or pneumothorax. Upper Abdomen: Enlarged spleen, 15 cm in greatest transverse dimension. Prominent to mildly enlarged lymph nodes in the upper abdomen, in the visualized retroperitoneum and along the Peri celiac chain. Low-density liver lesions  consistent with cysts. Musculoskeletal: Skeletal structures are demineralized. There multiple vertebral compression fractures, T4, T8, T9 as well as L1, which appear chronic. No bone lesions. IMPRESSION: 1. Interstitial lung disease reflected by peripheral cystic change, interstitial thickening, areas of honeycombing and architectural distortion, with a mid to upper lung predominance. 2. 1.1 cm ill-defined nodular opacity in the left lower lobe. This could be inflammatory. Neoplastic disease is possible. Consider one of the following in 3 months for both low-risk and high-risk individuals: (a) repeat chest CT, (b) follow-up PET-CT, or (c) tissue sampling. This recommendation follows the consensus statement: Guidelines for Management of Incidental Pulmonary Nodules Detected on CT Images: From the Fleischner Society 2017; Radiology 2017; 284:228-243. No other findings in the lungs to suggest active inflammation or infection. No pulmonary edema. 3. Enlarged lymph nodes, most apparent in the right axilla, milder adenopathy along the mediastinum and right hilum, with adenopathy also noted in the visualized  upper abdomen. There is also splenomegaly. The combination of findings is concerning for a lymphoproliferative disorder including lymphoma. 4. Coronary artery calcifications and aortic atherosclerosis. Aortic Atherosclerosis (ICD10-I70.0). Electronically Signed   By: Lajean Manes M.D.   On: 07/19/2020 21:07   DG Chest Portable 1 View  Result Date: 07/19/2020 CLINICAL DATA:  Weakness. EXAM: PORTABLE CHEST 1 VIEW COMPARISON:  02/10/2011. FINDINGS: Cardiac silhouette is normal in size. Normal mediastinal and hilar contours. Lungs show bilateral interstitial thickening similar to the prior exam allowing for differences in radiographic technique. No lung consolidation. No pleural effusion or pneumothorax. Skeletal structures are grossly intact. IMPRESSION: 1. No acute cardiopulmonary disease. 2. Chronic bilateral  interstitial thickening. Electronically Signed   By: Lajean Manes M.D.   On: 07/19/2020 19:56   [place] CORE BIOPSY (LYMPH NODES)  Result Date: 07/21/2020 INDICATION: 70 year old male presenting with weight loss, left lower lobe mass, and lymphadenopathy. EXAM: ULTRASOUND GUIDED core BIOPSY OF right axillary lymph node. MEDICATIONS: None. ANESTHESIA/SEDATION: Fentanyl 25 mcg IV; Versed 0.5 mg IV Moderate Sedation Time:  7 The patient was continuously monitored during the procedure by the interventional radiology nurse under my direct supervision. PROCEDURE: The procedure, risks, benefits, and alternatives were explained to the patient. Questions regarding the procedure were encouraged and answered. The patient understands and consents to the procedure. The right axilla was prepped with chlorhexidine in a sterile fashion, and a sterile drape was applied covering the operative field. A sterile gown and sterile gloves were used for the procedure. Preprocedure ultrasound evaluation demonstrated multiple prominent right axillary lymph nodes. The dominant node was selected for biopsy. The procedure was planned. Local anesthesia was provided with 1% Lidocaine. A small skin nick was made. Under direct ultrasound visualization, a total of 3, 18 gauge core biopsies were obtained. The samples were placed on saline soaked Telfa and sent to Pathology. Postprocedural imaging demonstrated no evidence of surrounding hematoma. Sterile bandage was placed. The patient tolerated procedure well was transferred back to floor in stable condition. COMPLICATIONS: None immediate. FINDINGS: Right axillary lymphadenopathy. IMPRESSION: Technically successful ultrasound-guided right axillary lymph node biopsy. Ruthann Cancer, MD [facility] Radiology Electronically Signed   By: Ruthann Cancer MD   On: 07/21/2020 10:49   [place] CORE BIOPSY (LYMPH NODES)  Final Result    CT ABDOMEN PELVIS WO CONTRAST   Final Result    CT Chest Wo Contrast  Final Result    DG Chest Portable 1 View  Final Result      Scheduled Meds: . calcitonin  4 Units/kg Subcutaneous BID  . enoxaparin (LOVENOX) injection  40 mg Subcutaneous Q24H  . fentaNYL      . midazolam       PRN Meds: acetaminophen **OR** acetaminophen, LORazepam, ondansetron (ZOFRAN) IV, oxyCODONE, promethazine (PHENERGAN) injection (IM or IVPB), traZODone Continuous Infusions: . sodium chloride 150 mL/hr at 07/21/20 1437  . promethazine (PHENERGAN) injection (IM or IVPB)       LOS: 2 days  Time spent: Greater than 50% of the 35 minute visit was spent in counseling/coordination of care for the patient as laid out in the A&P.   Dwyane Dee, MD [facility] 07/21/2020, 2:54 PM

## 2020-07-21 NOTE — Progress Notes (Signed)
Order received for PT and OT from Dr Sabino Gasser

## 2020-07-21 NOTE — Procedures (Signed)
Interventional Radiology Procedure Note  Procedure: Ultrasound guided right axillary lymph node biopsy  Findings: Please refer to procedural dictation for full description. 18 ga core x3.  Samples placed on saline-soaked telfa.  Complications: None immediate  Estimated Blood Loss: < 5 mL  Recommendations: Follow up Pathology results.   Ruthann Cancer, MD

## 2020-07-21 NOTE — Progress Notes (Signed)
Chaplain Maggie made initial visitation with patient and his sister. They requested Advanced Directive paperwork which Chaplain then supplied. Advised them to page on call Chaplain when patient is ready to sign the AD. Chaplain available to support as needed.

## 2020-07-21 NOTE — Progress Notes (Signed)
Patient returned from specials. BP is 83/77m HR 72, MAP 66. Dr Sabino Gasser notified. No new orders at this time

## 2020-07-21 NOTE — Progress Notes (Signed)
Patient clinically stable post Axillary LN biopsy per Dr Serafina Royals, tolerated well. Received Versed 0.5 mg along with Fentanyl 25 mcg IV for procedure. Denies complaints at this time. Report given to [facility] RN post procedure/recovery at bedside.

## 2020-07-22 DIAGNOSIS — R918 Other nonspecific abnormal finding of lung field: Secondary | ICD-10-CM | POA: Diagnosis not present

## 2020-07-22 LAB — CBC WITH DIFFERENTIAL/PLATELET
Abs Immature Granulocytes: 0.04 10*3/uL (ref 0.00–0.07)
Basophils Absolute: 0 10*3/uL (ref 0.0–0.1)
Basophils Relative: 0 %
Eosinophils Absolute: 0 10*3/uL (ref 0.0–0.5)
Eosinophils Relative: 0 %
HCT: 29.4 % — ABNORMAL LOW (ref 39.0–52.0)
Hemoglobin: 10.3 g/dL — ABNORMAL LOW (ref 13.0–17.0)
Immature Granulocytes: 1 %
Lymphocytes Relative: 10 %
Lymphs Abs: 0.3 10*3/uL — ABNORMAL LOW (ref 0.7–4.0)
MCH: 30.7 pg (ref 26.0–34.0)
MCHC: 35 g/dL (ref 30.0–36.0)
MCV: 87.5 fL (ref 80.0–100.0)
Monocytes Absolute: 0.2 10*3/uL (ref 0.1–1.0)
Monocytes Relative: 7 %
Neutro Abs: 2.3 10*3/uL (ref 1.7–7.7)
Neutrophils Relative %: 82 %
Platelets: 75 10*3/uL — ABNORMAL LOW (ref 150–400)
RBC: 3.36 MIL/uL — ABNORMAL LOW (ref 4.22–5.81)
RDW: 20.4 % — ABNORMAL HIGH (ref 11.5–15.5)
WBC: 2.8 10*3/uL — ABNORMAL LOW (ref 4.0–10.5)
nRBC: 0 % (ref 0.0–0.2)

## 2020-07-22 LAB — COMPREHENSIVE METABOLIC PANEL
ALT: 22 U/L (ref 0–44)
AST: 72 U/L — ABNORMAL HIGH (ref 15–41)
Albumin: 2.8 g/dL — ABNORMAL LOW (ref 3.5–5.0)
Alkaline Phosphatase: 79 U/L (ref 38–126)
Anion gap: 8 (ref 5–15)
BUN: 35 mg/dL — ABNORMAL HIGH (ref 8–23)
CO2: 26 mmol/L (ref 22–32)
Calcium: 10.2 mg/dL (ref 8.9–10.3)
Chloride: 103 mmol/L (ref 98–111)
Creatinine, Ser: 1.23 mg/dL (ref 0.61–1.24)
GFR, Estimated: >60 mL/min (ref >60)
Glucose, Bld: 69 mg/dL — ABNORMAL LOW (ref 70–99)
Potassium: 3.2 mmol/L — ABNORMAL LOW (ref 3.5–5.1)
Sodium: 137 mmol/L (ref 135–145)
Total Bilirubin: 1.5 mg/dL — ABNORMAL HIGH (ref 0.3–1.2)
Total Protein: 4.7 g/dL — ABNORMAL LOW (ref 6.5–8.1)

## 2020-07-22 LAB — LIPASE, BLOOD: Lipase: 234 U/L — ABNORMAL HIGH (ref 11–51)

## 2020-07-22 LAB — MAGNESIUM: Magnesium: 1.7 mg/dL (ref 1.7–2.4)

## 2020-07-22 LAB — CORTISOL-AM, BLOOD: Cortisol - AM: 19.1 ug/dL (ref 6.7–22.6)

## 2020-07-22 LAB — GLUCOSE, CAPILLARY
Glucose-Capillary: 65 mg/dL — ABNORMAL LOW (ref 70–99)
Glucose-Capillary: 68 mg/dL — ABNORMAL LOW (ref 70–99)
Glucose-Capillary: 90 mg/dL (ref 70–99)

## 2020-07-22 LAB — AMYLASE: Amylase: 200 U/L — ABNORMAL HIGH (ref 28–100)

## 2020-07-22 LAB — CALCIUM, IONIZED: Calcium, Ionized, Serum: 6.7 mg/dL — ABNORMAL HIGH (ref 4.5–5.6)

## 2020-07-22 MED ORDER — POTASSIUM CHLORIDE CRYS ER 20 MEQ PO TBCR
40.0000 meq | EXTENDED_RELEASE_TABLET | Freq: Once | ORAL | Status: AC
  Administered 2020-07-22: 40 meq via ORAL
  Filled 2020-07-22: qty 2

## 2020-07-22 MED ORDER — DEXTROSE IN LACTATED RINGERS 5 % IV SOLN: INTRAVENOUS | Status: DC

## 2020-07-22 MED ORDER — NYSTATIN 100000 UNIT/ML MT SUSP
5.0000 mL | Freq: Four times a day (QID) | OROMUCOSAL | Status: DC
  Administered 2020-07-22 – 2020-07-25 (×10): 500000 [IU] via ORAL
  Filled 2020-07-22 (×10): qty 5

## 2020-07-22 MED ORDER — PANTOPRAZOLE SODIUM 40 MG IV SOLR
40.0000 mg | Freq: Two times a day (BID) | INTRAVENOUS | Status: DC
  Administered 2020-07-22 – 2020-07-25 (×7): 40 mg via INTRAVENOUS
  Filled 2020-07-22 (×7): qty 40

## 2020-07-22 NOTE — Progress Notes (Signed)
Patrick Shepard   DOB:May 22, 1950   WU#:[id number]    Subjective: Feels poorly.  Complains of nausea.  Complains of epigastric pain.  Objective:  Vitals:   07/22/20 1515 07/22/20 2017  BP: 124/81 105/67  Pulse: (!) 106 98  Resp: 18 20  Temp: (!) 97.5 F (36.4 C) 98.7 F (37.1 C)  SpO2: 99% 96%     Intake/Output Summary (Last 24 hours) at 07/22/2020 2144 Last data filed at 07/22/2020 1904 Gross per 24 hour  Intake 590.22 ml  Output 1225 ml  Net -634.78 ml    Physical Exam Constitutional:      Comments: Thin cachectic male patient.  HENT:     Head: Normocephalic and atraumatic.     Mouth/Throat:     Pharynx: No oropharyngeal exudate.  Eyes:     Pupils: Pupils are equal, round, and reactive to light.  Cardiovascular:     Rate and Rhythm: Normal rate and regular rhythm.  Pulmonary:     Effort: No respiratory distress.     Breath sounds: No wheezing.     Comments: Decreased air entry bilaterally. Abdominal:     General: Bowel sounds are normal. There is no distension.     Palpations: Abdomen is soft. There is no mass.     Tenderness: There is no abdominal tenderness. There is no guarding or rebound.     Comments: Mild epigastric tenderness.  Musculoskeletal:        General: No tenderness. Normal range of motion.     Cervical back: Normal range of motion and neck supple.  Skin:    General: Skin is warm.  Neurological:     Mental Status: He is alert and oriented to person, place, and time.  Psychiatric:        Mood and Affect: Affect normal.      Labs:  Lab Results  Component Value Date   WBC 2.8 (L) 07/22/2020   HGB 10.3 (L) 07/22/2020   HCT 29.4 (L) 07/22/2020   MCV 87.5 07/22/2020   PLT 75 (L) 07/22/2020   NEUTROABS 2.3 07/22/2020    Lab Results  Component Value Date   NA 137 07/22/2020   K 3.2 (L) 07/22/2020   CL 103 07/22/2020   CO2 26 07/22/2020    Studies:  [place] CORE BIOPSY (LYMPH NODES)  Result Date: 07/21/2020 INDICATION: 70 year old male  presenting with weight loss, left lower lobe mass, and lymphadenopathy. EXAM: ULTRASOUND GUIDED core BIOPSY OF right axillary lymph node. MEDICATIONS: None. ANESTHESIA/SEDATION: Fentanyl 25 mcg IV; Versed 0.5 mg IV Moderate Sedation Time:  7 The patient was continuously monitored during the procedure by the interventional radiology nurse under my direct supervision. PROCEDURE: The procedure, risks, benefits, and alternatives were explained to the patient. Questions regarding the procedure were encouraged and answered. The patient understands and consents to the procedure. The right axilla was prepped with chlorhexidine in a sterile fashion, and a sterile drape was applied covering the operative field. A sterile gown and sterile gloves were used for the procedure. Preprocedure ultrasound evaluation demonstrated multiple prominent right axillary lymph nodes. The dominant node was selected for biopsy. The procedure was planned. Local anesthesia was provided with 1% Lidocaine. A small skin nick was made. Under direct ultrasound visualization, a total of 3, 18 gauge core biopsies were obtained. The samples were placed on saline soaked Telfa and sent to Pathology. Postprocedural imaging demonstrated no evidence of surrounding hematoma. Sterile bandage was placed. The patient tolerated procedure well was transferred back to  floor in stable condition. COMPLICATIONS: None immediate. FINDINGS: Right axillary lymphadenopathy. IMPRESSION: Technically successful ultrasound-guided right axillary lymph node biopsy. Ruthann Cancer, MD [facility] Radiology Electronically Signed   By: Ruthann Cancer MD   On: 07/21/2020 10:49    Hypercalcemia -70 year old male patient history of smoking is currently in the hospital for generalized weakness/noted to have hypercalcemia   # Hypercalcemia: Concerning for malignancy-[see below]-calcium improved to 10.2 [albumin]. continue calcitonin s/p  bisphosphonate on 5/30.   #Abdominal pain/nausea-question pancreatitis [hypercalcemia]; check lipase amylase.   # Pancytopenia/splenomegaly/generalized lymphadenopathy-concerning for lymphoproliferative disorder-s/p lymph node biopsy.  #Interstitial lung disease based on imaging / lung nodule ~1.1cm LLL/active smoker. Out pt follow up.   Cammie Sickle, MD 07/22/2020  9:44 PM

## 2020-07-22 NOTE — Progress Notes (Signed)
  Chaplain On-Call was paged by Salley Scarlet with report of patient's sister desiring for patient to have Advance Directives documents completed.  Chaplain met patient's sister [name] at bedside of the patient. Patient is medicated and sleepy, and is unable to participate in conversation.  Patient's sister stated that she and the patient are awaiting results of a biopsy, which will determine what treatments can be offered to the patient, and subsequent choices he might make.  Sister stated also that she will need to read the AD documents to the patient due to his limited ability to read.  Chaplain will proceed to learn if there will be hospital Volunteers available this afternoon to serve as witnesses for the documents, if the patient decides to complete them. Chaplain will also seek to learn the availability of a Notary for the process.  Chaplain Pollyann Samples M.Div., [facility]

## 2020-07-22 NOTE — Progress Notes (Signed)
Mobility Specialist - Progress Note   07/22/20 1700  Mobility  Activity Refused mobility  Mobility performed by Mobility specialist    Pt fatigued following session with PT/OT, declined mobility this date. Will attempt session another date/time as appropriate.    Kathee Delton Mobility Specialist 07/22/20, 5:09 PM

## 2020-07-22 NOTE — Progress Notes (Signed)
Hypoglycemic Event  CBG: 65, 68  Treatment: orange juice both times  Symptoms: sleepy  Follow-up CBG: Time:1055 CBG Result:90  Possible Reasons for Event: poor appetite  Comments/MD notified:MD aware    Haynes Dage

## 2020-07-22 NOTE — Evaluation (Signed)
Physical Therapy Evaluation Patient Details Name: EASON HOUSMAN MRN: [id number] DOB: 1950-04-05 Today's Date: 07/22/2020   History of Present Illness  JAYANTH SZCZESNIAK is a 70 y.o. male with no prior  medical history, hasn't seen a doctor for many years.  He presents to the emergency department for generalized weakness.  CT scan which showed approximately 1 cm left lower lobe lung nodule; along with bilateral axial adenopathy' mediastinal adenopathy; interstitial changes of the lung.  CT of the abdomen pelvis-extensive retroperitoneal adenopathy with splenomegaly.  Clinical Impression  Pt is a pleasant 70 year old male who was admitted for lung mass. Pt performs bed mobility with cga, transfers with min assist, and ambulation with cga and HHA. Pt demonstrates deficits with strength/endurance/mobility. Would benefit from skilled PT to address above deficits and promote optimal return to PLOF; recommend transition to STR upon discharge from acute hospitalization.     Follow Up Recommendations SNF    Equipment Recommendations  None recommended by PT    Recommendations for Other Services       Precautions / Restrictions Precautions Precautions: Fall Restrictions Weight Bearing Restrictions: No      Mobility  Bed Mobility Overal bed mobility: Needs Assistance Bed Mobility: Supine to Sit     Supine to sit: Min assist     General bed mobility comments: needs assist for B LE and trunkal elevation. Upright posture noted once seated    Transfers Overall transfer level: Needs assistance Equipment used: None Transfers: Sit to/from Stand Sit to Stand: Min guard         General transfer comment: safe technique with upright posture.  Ambulation/Gait Ambulation/Gait assistance: Min assist Gait Distance (Feet): 3 Feet Assistive device: 1 person hand held assist Gait Pattern/deviations: Step-to pattern     General Gait Details: ambulated over to recliner with step to gait  pattern. Fatigues quickly.  Stairs            Wheelchair Mobility    Modified Rankin (Stroke Patients Only)       Balance Overall balance assessment: Needs assistance Sitting-balance support: Feet supported Sitting balance-Leahy Scale: Good     Standing balance support: Bilateral upper extremity supported Standing balance-Leahy Scale: Good                               Pertinent Vitals/Pain Pain Assessment: No/denies pain    Home Living Family/patient expects to be discharged to:: Assisted living               Home Equipment: None      Prior Function Level of Independence: Independent         Comments: reports he was indep prior with no history of falls     Hand Dominance        Extremity/Trunk Assessment   Upper Extremity Assessment Upper Extremity Assessment: Generalized weakness (B UE grossly 4/5)    Lower Extremity Assessment Lower Extremity Assessment: Generalized weakness (B LE grossly 4/5; reports B foot numbness)       Communication   Communication: No difficulties  Cognition Arousal/Alertness: Awake/alert Behavior During Therapy: WFL for tasks assessed/performed Overall Cognitive Status: Within Functional Limits for tasks assessed                                        General Comments  Exercises Other Exercises Other Exercises: seated ther-ex performed of B LE including alt. marching and LAQ. 10 reps with supervision   Assessment/Plan    PT Assessment Patient needs continued PT services  PT Problem List Decreased strength;Decreased activity tolerance;Decreased mobility       PT Treatment Interventions Gait training;Therapeutic exercise;Balance training    PT Goals (Current goals can be found in the Care Plan section)  Acute Rehab PT Goals Patient Stated Goal: to go home PT Goal Formulation: With patient Time For Goal Achievement: 08/05/20 Potential to Achieve Goals: Good     Frequency Min 2X/week   Barriers to discharge        Co-evaluation               AM-PAC PT "6 Clicks" Mobility  Outcome Measure Help needed turning from your back to your side while in a flat bed without using bedrails?: A Little Help needed moving from lying on your back to sitting on the side of a flat bed without using bedrails?: A Little Help needed moving to and from a bed to a chair (including a wheelchair)?: A Little Help needed standing up from a chair using your arms (e.g., wheelchair or bedside chair)?: A Little Help needed to walk in hospital room?: A Little Help needed climbing 3-5 steps with a railing? : A Little 6 Click Score: 18    End of Session Equipment Utilized During Treatment: Gait belt Activity Tolerance: Patient tolerated treatment well Patient left: in chair;with chair alarm set Nurse Communication: Mobility status PT Visit Diagnosis: Muscle weakness (generalized) (M62.81);Difficulty in walking, not elsewhere classified (R26.2);Pain    Time: 0086-7619 PT Time Calculation (min) (ACUTE ONLY): 20 min   Charges:   PT Evaluation $PT Eval Low Complexity: 1 Low PT Treatments $Therapeutic Exercise: 8-22 mins        Greggory Stallion, PT, DPT [phone]   Ritvik Mczeal 07/22/2020, 4:11 PM

## 2020-07-22 NOTE — Progress Notes (Signed)
  Chaplain On-Call attempted follow-up visit with patient.  At 1:45 p.m. the patient was receiving Physical Therapy treatment and is not available for discussion about Advance Directives.  Chaplain will make a referral to the Evening Chaplain for follow-up.  Chaplain Pollyann Samples M.Div., [facility]

## 2020-07-22 NOTE — Progress Notes (Signed)
PROGRESS NOTE    Patrick Shepard  NUU:[id number] DOB: Jul 20, 1950 DOA: 07/19/2020 PCP: Pcp, No   Brief Narrative:  70 year old with past medical history significant for tobacco use who presented to the ER complaining of worsening weakness.  He has not seen medical care since 2007 due to an aversion to healthcare.  Patient has lost 20 to 30 pounds of weight since Christmas.  CT chest on admission showed left lower lobe opacity measuring 1.1 cm and enlarged axillary lymphadenopathy along with mediastinal lymphadenopathy.  He was admitted with hypercalcemia.  Started on IV fluids.  He underwent ultrasound-guided right axillary lymph node biopsy on 5/31st by IR.    Assessment & Plan:   Principal Problem:   Mass of lower lobe of left lung Active Problems:   Hypercalcemia   Emphysema lung (HCC)   Weight loss, abnormal   Pancytopenia (HCC)  1-Lung lesion: -Left lower lobe opacity measuring 1.1 cm with associated axillary and mediastinal lymphadenopathy. -Oncology  consulted and following -This CT abdomen and pelvis show extensive retroperitoneal lymphadenopathy and splenomegaly. -Awaiting lymph node biopsy results  2-Hypercalcemia: Calcium on admission 13.8. -Received calcitonin and zoledronic acid on 5/30. -Also received IV fluids. -Serum calcium  has normalized. -Suspect related to malignancy  3-Pancytopenia, probably related to underlying malignancy, awaiting lymph node biopsy 4-Hypoglycemia: Started on D5. Cortisol level normal.   5-oral thrush: Started on nystatin 6-mild elevation of lipase: Supportive care  Tobacco Use: Nicotine patch as needed  Nutrition Problem: Increased nutrient needs Etiology: cancer and cancer related treatments    Signs/Symptoms: estimated needs    Interventions: Ensure Enlive (each supplement provides 350kcal and 20 grams of protein)  Estimated body mass index is 18.17 kg/m as calculated from the following:   Height as of this  encounter: 5\' 8"  (1.727 m).   Weight as of this encounter: 54.2 kg.   DVT prophylaxis: Lovenox Code Status: Full code Family Communication: Sister who was at bedside.  Disposition Plan:  Status is: Inpatient  Remains inpatient appropriate because:IV treatments appropriate due to intensity of illness or inability to take PO   Dispo: The patient is from: Home              Anticipated d/c is to: Home              Patient currently is not medically stable to d/c.   Difficult to place patient No        Consultants:   Oncology.   Procedures:   Ultrasound-guided lymph node biopsy  Antimicrobials:    Subjective: He is alert, weak. Poor oral intake, had BM yesterday.   Objective: Vitals:   07/21/20 2026 07/22/20 0041 07/22/20 0436 07/22/20 1053  BP: 105/60 96/64 (!) 88/63 94/63  Pulse: 79 73 75 94  Resp: 18 16 18 16   Temp: 97.7 F (36.5 C) 97.6 F (36.4 C) 97.6 F (36.4 C) (!) 97.3 F (36.3 C)  TempSrc: Oral   Oral  SpO2: 96% 97% 98% 94%  Weight:      Height:        Intake/Output Summary (Last 24 hours) at 07/22/2020 1512 Last data filed at 07/22/2020 1358 Gross per 24 hour  Intake 590.22 ml  Output 1800 ml  Net -1209.78 ml   Filed Weights   07/19/20 1755 07/21/20 1833  Weight: 72.6 kg 54.2 kg    Examination:  General exam: thin appearing, Oral Trush.  Respiratory system: Clear to auscultation. Respiratory effort normal. Cardiovascular system: S1 & S2 heard, RRR.  No JVD, murmurs, rubs, gallops or clicks. No pedal edema. Gastrointestinal system: Abdomen is nondistended, soft and nontender. No organomegaly or masses felt. Normal bowel sounds heard. Central nervous system: Alert and oriented.  Extremities: Symmetric 5 x 5 power.   Data Reviewed: I have personally reviewed following labs and imaging studies  CBC: Recent Labs  Lab 07/19/20 1751 07/20/20 1118 07/21/20 0626 07/22/20 0630  WBC 2.6* 2.2* 2.0* 2.8*  NEUTROABS  --  1.7 1.5* 2.3  HGB  10.1* 10.7* 8.9* 10.3*  HCT 29.3* 31.2* 26.0* 29.4*  MCV 87.7 88.6 89.7 87.5  PLT 87* 83* 71* 75*   Basic Metabolic Panel: Recent Labs  Lab 07/19/20 1751 07/20/20 0607 07/21/20 0626 07/22/20 0630  NA 129* 133* 136 137  K 3.9 3.9 3.6 3.2*  CL 93* 100 100 103  CO2 22 23 26 26   GLUCOSE 95 82 82 69*  BUN 61* 56* 43* 35*  CREATININE 1.65* 1.41* 1.29* 1.23  CALCIUM 13.8* 12.9* 11.6* 10.2  MG  --   --  1.5* 1.7   GFR: Estimated Creatinine Clearance: 43.5 mL/min (by C-G formula based on SCr of 1.23 mg/dL). Liver Function Tests: Recent Labs  Lab 07/20/20 1118 07/21/20 0626 07/22/20 0630  AST 70* 61* 72*  ALT 22 22 22   ALKPHOS 65 82 79  BILITOT 1.6* 1.3* 1.5*  PROT 5.0* 4.4* 4.7*  ALBUMIN 3.0* 2.6* 2.8*   Recent Labs  Lab 07/22/20 0630  LIPASE 234*  AMYLASE 200*   No results for input(s): AMMONIA in the last 168 hours. Coagulation Profile: No results for input(s): INR, PROTIME in the last 168 hours. Cardiac Enzymes: Recent Labs  Lab 07/19/20 1751  CKTOTAL 46*   BNP (last 3 results) No results for input(s): PROBNP in the last 8760 hours. HbA1C: No results for input(s): HGBA1C in the last 72 hours. CBG: Recent Labs  Lab 07/22/20 0956 07/22/20 1032 07/22/20 1052  GLUCAP 65* 68* 90   Lipid Profile: No results for input(s): CHOL, HDL, LDLCALC, TRIG, CHOLHDL, LDLDIRECT in the last 72 hours. Thyroid Function Tests: No results for input(s): TSH, T4TOTAL, FREET4, T3FREE, THYROIDAB in the last 72 hours. Anemia Panel: No results for input(s): VITAMINB12, FOLATE, FERRITIN, TIBC, IRON, RETICCTPCT in the last 72 hours. Sepsis Labs: No results for input(s): PROCALCITON, LATICACIDVEN in the last 168 hours.  Recent Results (from the past 240 hour(s))  Resp Panel by RT-PCR (Flu A&B, Covid) Nasopharyngeal Swab     Status: None   Collection Time: 07/19/20  6:26 PM   Specimen: Nasopharyngeal Swab; Nasopharyngeal(NP) swabs in vial transport medium  Result Value Ref Range  Status   SARS Coronavirus 2 by RT PCR NEGATIVE NEGATIVE Final    Comment: (NOTE) SARS-CoV-2 target nucleic acids are NOT DETECTED.  The SARS-CoV-2 RNA is generally detectable in upper respiratory specimens during the acute phase of infection. The lowest concentration of SARS-CoV-2 viral copies this assay can detect is 138 copies/mL. A negative result does not preclude SARS-Cov-2 infection and should not be used as the sole basis for treatment or other patient management decisions. A negative result may occur with  improper specimen collection/handling, submission of specimen other than nasopharyngeal swab, presence of viral mutation(s) within the areas targeted by this assay, and inadequate number of viral copies(<138 copies/mL). A negative result must be combined with clinical observations, patient history, and epidemiological information. The expected result is Negative.  Fact Sheet for Patients:  EntrepreneurPulse.com.au  Fact Sheet for Healthcare Providers:  IncredibleEmployment.be  This test  is no t yet approved or cleared by the [place] and  has been authorized for detection and/or diagnosis of SARS-CoV-2 by FDA under an Emergency Use Authorization (EUA). This EUA will remain  in effect (meaning this test can be used) for the duration of the COVID-19 declaration under Section 564(b)(1) of the Act, 21 U.S.C.section 360bbb-3(b)(1), unless the authorization is terminated  or revoked sooner.       Influenza A by PCR NEGATIVE NEGATIVE Final   Influenza B by PCR NEGATIVE NEGATIVE Final    Comment: (NOTE) The Xpert Xpress SARS-CoV-2/FLU/RSV plus assay is intended as an aid in the diagnosis of influenza from Nasopharyngeal swab specimens and should not be used as a sole basis for treatment. Nasal washings and aspirates are unacceptable for Xpert Xpress SARS-CoV-2/FLU/RSV testing.  Fact Sheet for  Patients: EntrepreneurPulse.com.au  Fact Sheet for Healthcare Providers: IncredibleEmployment.be  This test is not yet approved or cleared by the [place] FDA and has been authorized for detection and/or diagnosis of SARS-CoV-2 by FDA under an Emergency Use Authorization (EUA). This EUA will remain in effect (meaning this test can be used) for the duration of the COVID-19 declaration under Section 564(b)(1) of the Act, 21 U.S.C. section 360bbb-3(b)(1), unless the authorization is terminated or revoked.  Performed at [facility], [address] [place], [place] [address]          Radiology Studies: [place] CORE BIOPSY (LYMPH NODES)  Result Date: 07/21/2020 INDICATION: 70 year old male presenting with weight loss, left lower lobe mass, and lymphadenopathy. EXAM: ULTRASOUND GUIDED core BIOPSY OF right axillary lymph node. MEDICATIONS: None. ANESTHESIA/SEDATION: Fentanyl 25 mcg IV; Versed 0.5 mg IV Moderate Sedation Time:  7 The patient was continuously monitored during the procedure by the interventional radiology nurse under my direct supervision. PROCEDURE: The procedure, risks, benefits, and alternatives were explained to the patient. Questions regarding the procedure were encouraged and answered. The patient understands and consents to the procedure. The right axilla was prepped with chlorhexidine in a sterile fashion, and a sterile drape was applied covering the operative field. A sterile gown and sterile gloves were used for the procedure. Preprocedure ultrasound evaluation demonstrated multiple prominent right axillary lymph nodes. The dominant node was selected for biopsy. The procedure was planned. Local anesthesia was provided with 1% Lidocaine. A small skin nick was made. Under direct ultrasound visualization, a total of 3, 18 gauge core biopsies were obtained. The samples were placed on saline soaked Telfa and sent to Pathology.  Postprocedural imaging demonstrated no evidence of surrounding hematoma. Sterile bandage was placed. The patient tolerated procedure well was transferred back to floor in stable condition. COMPLICATIONS: None immediate. FINDINGS: Right axillary lymphadenopathy. IMPRESSION: Technically successful ultrasound-guided right axillary lymph node biopsy. Ruthann Cancer, MD [facility] Radiology Electronically Signed   By: Ruthann Cancer MD   On: 07/21/2020 10:49        Scheduled Meds: . cholecalciferol  1,000 Units Oral Daily  . enoxaparin (LOVENOX) injection  40 mg Subcutaneous Q24H  . feeding supplement  237 mL Oral BID BM  . nystatin  5 mL Oral QID  . pantoprazole (PROTONIX) IV  40 mg Intravenous Q12H  . potassium chloride  40 mEq Oral Once   Continuous Infusions: . dextrose 5% lactated ringers 100 mL/hr at 07/22/20 1326  . promethazine (PHENERGAN) injection (IM or IVPB)       LOS: 3 days    Time spent: 35 minutes    Jeily Guthridge  Desiree Lucy, MD [facility]   If 7PM-7AM, please contact night-coverage [email or web address]  07/22/2020, 3:12 PM

## 2020-07-22 NOTE — Evaluation (Signed)
Occupational Therapy Evaluation Patient Details Name: Patrick Shepard MRN: [id number] DOB: [birth year] Today's Date: 07/22/2020    History of Present Illness Patrick Shepard is a 70 y.o. male with no prior  medical history, hasn't seen a doctor for many years.  He presents to the emergency department for generalized weakness.  CT scan which showed approximately 1 cm left lower lobe lung nodule; along with bilateral axial adenopathy' mediastinal adenopathy; interstitial changes of the lung.  CT of the abdomen pelvis-extensive retroperitoneal adenopathy with splenomegaly.   Clinical Impression   Patient presenting with decreased I in self care, functional mobility/transfers, strength, endurance, and safety awareness. Patient reports living in an ALF independently PTA. Patient currently functioning at min A and fatigues very quickly. Pt has only been sitting up in recliner chair for 30 minutes and requesting to return to bed. Patient will benefit from acute OT to increase overall independence in the areas of ADLs, functional mobility, and safety awareness in order to safely discharge to next venue of care.    Follow Up Recommendations  SNF;Supervision/Assistance - 24 hour    Equipment Recommendations  Other (comment) (defer to next venue of care)       Precautions / Restrictions Precautions Precautions: Fall Restrictions Weight Bearing Restrictions: No      Mobility Bed Mobility Overal bed mobility: Needs Assistance Bed Mobility: Sit to Supine     Supine to sit: Min assist Sit to supine: Min assist   General bed mobility comments: Assistanc for B LEs    Transfers Overall transfer level: Needs assistance Equipment used: 1 person hand held assist Transfers: Sit to/from Stand Sit to Stand: Min guard;Min assist         General transfer comment: safe technique with upright posture.    Balance Overall balance assessment: Needs assistance Sitting-balance support: Feet  supported Sitting balance-Leahy Scale: Good     Standing balance support: Bilateral upper extremity supported Standing balance-Leahy Scale: Good                             ADL either performed or assessed with clinical judgement   ADL       Grooming: Wash/dry hands;Wash/dry face;Sitting;Supervision/safety;Set up                                       Vision Patient Visual Report: No change from baseline              Pertinent Vitals/Pain Pain Assessment: No/denies pain        Extremity/Trunk Assessment Upper Extremity Assessment Upper Extremity Assessment: Generalized weakness   Lower Extremity Assessment Lower Extremity Assessment: Generalized weakness       Communication Communication Communication: No difficulties   Cognition Arousal/Alertness: Awake/alert Behavior During Therapy: WFL for tasks assessed/performed Overall Cognitive Status: Within Functional Limits for tasks assessed                                        Exercises Exercises: Other exercises Other Exercises Other Exercises: seated ther-ex performed of B LE including alt. marching and LAQ. 10 reps with supervision        Home Living Family/patient expects to be discharged to:: Assisted living  Home Equipment: None          Prior Functioning/Environment Level of Independence: Independent        Comments: reports he was indep prior with no history of falls        OT Problem List: Decreased strength;Impaired balance (sitting and/or standing);Decreased cognition;Decreased activity tolerance;Decreased coordination;Decreased safety awareness;Decreased knowledge of use of DME or AE      OT Treatment/Interventions: Self-care/ADL training;Manual therapy;Therapeutic exercise;Modalities;Patient/family education;Balance training;Energy conservation;Therapeutic activities;Cognitive  remediation/compensation;DME and/or AE instruction    OT Goals(Current goals can be found in the care plan section) Acute Rehab OT Goals Patient Stated Goal: to go home OT Goal Formulation: With patient Time For Goal Achievement: 08/05/20 Potential to Achieve Goals: Good ADL Goals Pt Will Perform Grooming: with modified independence;standing Pt Will Perform Lower Body Dressing: with modified independence;sit to/from stand Pt Will Transfer to Toilet: with modified independence;ambulating Pt Will Perform Toileting - Clothing Manipulation and hygiene: with modified independence;sit to/from stand  OT Frequency: Min 2X/week   Barriers to D/C: Decreased caregiver support             AM-PAC OT "6 Clicks" Daily Activity     Outcome Measure Help from another person eating meals?: None Help from another person taking care of personal grooming?: A Little Help from another person toileting, which includes using toliet, bedpan, or urinal?: A Little Help from another person bathing (including washing, rinsing, drying)?: A Little Help from another person to put on and taking off regular upper body clothing?: A Little Help from another person to put on and taking off regular lower body clothing?: A Little 6 Click Score: 19   End of Session Nurse Communication: Mobility status  Activity Tolerance: Patient limited by fatigue Patient left: in bed;with call bell/phone within reach;with bed alarm set  OT Visit Diagnosis: Unsteadiness on feet (R26.81);Repeated falls (R29.6);Muscle weakness (generalized) (M62.81)                Time: 1610-9604 OT Time Calculation (min): 15 min Charges:  OT General Charges $OT Visit: 1 Visit OT Evaluation $OT Eval Moderate Complexity: 1 [address], [place], OTR/L , CBIS ascom [phone]  07/22/20, 4:28 PM

## 2020-07-22 NOTE — Progress Notes (Signed)
Patient report pain in mid abdominal area beginning of shift 5-8 on 0/10 scale. Tylenol given at first once and had one dose of oxycodone 5 mg with relief. Ativan given for restlessness and anxiety which helped.

## 2020-07-23 DIAGNOSIS — R918 Other nonspecific abnormal finding of lung field: Secondary | ICD-10-CM | POA: Diagnosis not present

## 2020-07-23 LAB — CBC WITH DIFFERENTIAL/PLATELET
Abs Immature Granulocytes: 0.02 10*3/uL (ref 0.00–0.07)
Basophils Absolute: 0 10*3/uL (ref 0.0–0.1)
Basophils Relative: 0 %
Eosinophils Absolute: 0 10*3/uL (ref 0.0–0.5)
Eosinophils Relative: 0 %
HCT: 25.5 % — ABNORMAL LOW (ref 39.0–52.0)
Hemoglobin: 8.9 g/dL — ABNORMAL LOW (ref 13.0–17.0)
Immature Granulocytes: 1 %
Lymphocytes Relative: 13 %
Lymphs Abs: 0.3 10*3/uL — ABNORMAL LOW (ref 0.7–4.0)
MCH: 30.8 pg (ref 26.0–34.0)
MCHC: 34.9 g/dL (ref 30.0–36.0)
MCV: 88.2 fL (ref 80.0–100.0)
Monocytes Absolute: 0.2 10*3/uL (ref 0.1–1.0)
Monocytes Relative: 7 %
Neutro Abs: 1.9 10*3/uL (ref 1.7–7.7)
Neutrophils Relative %: 79 %
Platelets: 66 10*3/uL — ABNORMAL LOW (ref 150–400)
RBC: 2.89 MIL/uL — ABNORMAL LOW (ref 4.22–5.81)
RDW: 20.3 % — ABNORMAL HIGH (ref 11.5–15.5)
WBC: 2.4 10*3/uL — ABNORMAL LOW (ref 4.0–10.5)
nRBC: 0 % (ref 0.0–0.2)

## 2020-07-23 LAB — CALCIUM, IONIZED: Calcium, Ionized, Serum: 6.6 mg/dL — ABNORMAL HIGH (ref 4.5–5.6)

## 2020-07-23 LAB — COMPREHENSIVE METABOLIC PANEL
ALT: 18 U/L (ref 0–44)
AST: 66 U/L — ABNORMAL HIGH (ref 15–41)
Albumin: 2.5 g/dL — ABNORMAL LOW (ref 3.5–5.0)
Alkaline Phosphatase: 61 U/L (ref 38–126)
Anion gap: 7 (ref 5–15)
BUN: 27 mg/dL — ABNORMAL HIGH (ref 8–23)
CO2: 24 mmol/L (ref 22–32)
Calcium: 9.5 mg/dL (ref 8.9–10.3)
Chloride: 102 mmol/L (ref 98–111)
Creatinine, Ser: 0.96 mg/dL (ref 0.61–1.24)
GFR, Estimated: >60 mL/min (ref >60)
Glucose, Bld: 70 mg/dL (ref 70–99)
Potassium: 3.3 mmol/L — ABNORMAL LOW (ref 3.5–5.1)
Sodium: 133 mmol/L — ABNORMAL LOW (ref 135–145)
Total Bilirubin: 1.1 mg/dL (ref 0.3–1.2)
Total Protein: 4.1 g/dL — ABNORMAL LOW (ref 6.5–8.1)

## 2020-07-23 LAB — MAGNESIUM: Magnesium: 1.3 mg/dL — ABNORMAL LOW (ref 1.7–2.4)

## 2020-07-23 MED ORDER — POTASSIUM CHLORIDE CRYS ER 20 MEQ PO TBCR
40.0000 meq | EXTENDED_RELEASE_TABLET | ORAL | Status: AC
  Administered 2020-07-23: 40 meq via ORAL
  Filled 2020-07-23: qty 2

## 2020-07-23 MED ORDER — MAGNESIUM OXIDE -MG SUPPLEMENT 400 (240 MG) MG PO TABS
400.0000 mg | ORAL_TABLET | Freq: Two times a day (BID) | ORAL | Status: DC
  Administered 2020-07-23 – 2020-07-25 (×5): 400 mg via ORAL
  Filled 2020-07-23 (×5): qty 1

## 2020-07-23 MED ORDER — MAGNESIUM SULFATE 2 GM/50ML IV SOLN
2.0000 g | Freq: Once | INTRAVENOUS | Status: AC
  Administered 2020-07-23: 2 g via INTRAVENOUS
  Filled 2020-07-23: qty 50

## 2020-07-23 NOTE — Care Management Important Message (Signed)
Important Message  Patient Details  Name: Patrick Shepard MRN: [id number] Date of Birth: 11-Nov-1950   Medicare Important Message Given:  Yes     Dannette Barbara 07/23/2020, 1:20 PM

## 2020-07-23 NOTE — Plan of Care (Signed)
  Problem: Nutrition: Goal: Adequate nutrition will be maintained Outcome: Progressing   Problem: Skin Integrity: Goal: Risk for impaired skin integrity will decrease Outcome: Progressing   

## 2020-07-23 NOTE — Progress Notes (Signed)
[place]   DOB:1950/10/19   GY#:[id number]    Subjective: Patient denies any worsening abdominal pain.  Complains of abdominal nausea.  As per the sister patient has been eating poorly..   Objective:  Vitals:   07/23/20 1539 07/23/20 1916  BP: 108/70 115/71  Pulse: 93 98  Resp:  16  Temp: 97.9 F (36.6 C) 98 F (36.7 C)  SpO2: 97% 96%     Intake/Output Summary (Last 24 hours) at 07/23/2020 2141 Last data filed at 07/23/2020 2100 Gross per 24 hour  Intake 1920.99 ml  Output 975 ml  Net 945.99 ml    Physical Exam Constitutional:      Comments: Cachectic appearing Caucasian male patient.  No acute distress.  HENT:     Head: Normocephalic and atraumatic.     Mouth/Throat:     Pharynx: No oropharyngeal exudate.  Eyes:     Pupils: Pupils are equal, round, and reactive to light.  Cardiovascular:     Rate and Rhythm: Normal rate and regular rhythm.  Pulmonary:     Effort: No respiratory distress.     Breath sounds: No wheezing.     Comments: Decreased breath sounds bilaterally the bases. Abdominal:     General: Bowel sounds are normal. There is no distension.     Palpations: Abdomen is soft. There is no mass.     Tenderness: There is no abdominal tenderness. There is no guarding or rebound.  Musculoskeletal:        General: No tenderness. Normal range of motion.     Cervical back: Normal range of motion and neck supple.  Skin:    General: Skin is warm.  Neurological:     Mental Status: He is alert and oriented to person, place, and time.  Psychiatric:        Mood and Affect: Affect normal.      Labs:  Lab Results  Component Value Date   WBC 2.4 (L) 07/23/2020   HGB 8.9 (L) 07/23/2020   HCT 25.5 (L) 07/23/2020   MCV 88.2 07/23/2020   PLT 66 (L) 07/23/2020   NEUTROABS 1.9 07/23/2020    Lab Results  Component Value Date   NA 133 (L) 07/23/2020   K 3.3 (L) 07/23/2020   CL 102 07/23/2020   CO2 24 07/23/2020    Studies:  No results  found.  Hypercalcemia -70 year old male patient history of smoking is currently in the hospital for generalized weakness/noted to have hypercalcemia   # Hypercalcemia: Concerning for malignancy-[see below]-calcium improved to 10.2 [albumin]. s/p bisphosphonate on 5/30.   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 Out pt follow up.  #From medical oncology standpoint, patient can be discharged if medically stable.  We will plan outpatient follow-up/review results of the biopsy results.  Discussed with patient's sister by the bedside.  Cammie Sickle, MD 07/23/2020  9:41 PM

## 2020-07-23 NOTE — Progress Notes (Signed)
PROGRESS NOTE    Patrick Shepard  QMV:[id number] DOB: 1950/08/17 DOA: 07/19/2020 PCP: Pcp, No   Brief Narrative:  70 year old with past medical history significant for tobacco use who presented to the ER complaining of worsening weakness.  He has not seen medical care since 2007 due to an aversion to healthcare.  Patient has lost 20 to 30 pounds of weight since Christmas.  CT chest on admission showed left lower lobe opacity measuring 1.1 cm and enlarged axillary lymphadenopathy along with mediastinal lymphadenopathy.  He was admitted with hypercalcemia.  Started on IV fluids.  He underwent ultrasound-guided right axillary lymph node biopsy on 5/31st by IR.    Assessment & Plan:   Principal Problem:   Mass of lower lobe of left lung Active Problems:   Hypercalcemia   Emphysema lung (HCC)   Weight loss, abnormal   Pancytopenia (HCC)  1-Lung lesion: -Left lower lobe opacity measuring 1.1 cm with associated axillary and mediastinal lymphadenopathy. -Oncology  consulted and following -This CT abdomen and pelvis show extensive retroperitoneal lymphadenopathy and splenomegaly. -Awaiting lymph node biopsy results  2-Hypercalcemia: Calcium on admission 13.8. -Received calcitonin and zoledronic acid on 5/30. -Also received IV fluids. -Serum calcium  has normalized. -Suspect related to malignancy  3-Pancytopenia, probably related to underlying malignancy, awaiting lymph node biopsy.  4-Hypoglycemia: Started on D5. Cortisol level normal.  Continue with IV fluids. Encourage oral intake.   5-oral thrush: Started on nystatin 6-mild elevation of lipase: Supportive care FTT/weakness; might be related to underline malignancy.  Hypokalemia/ Hypomagnesemia; replete IV mag and oral potasium.   Tobacco Use: Nicotine patch as needed  Nutrition Problem: Increased nutrient needs Etiology: cancer and cancer related treatments    Signs/Symptoms: estimated needs    Interventions:  Ensure Enlive (each supplement provides 350kcal and 20 grams of protein)  Estimated body mass index is 18.17 kg/m as calculated from the following:   Height as of this encounter: 5\' 8"  (1.727 m).   Weight as of this encounter: 54.2 kg.   DVT prophylaxis: Lovenox Code Status: Full code Family Communication: Sister who was at bedside.  Disposition Plan:  Status is: Inpatient  Remains inpatient appropriate because:IV treatments appropriate due to intensity of illness or inability to take PO   Dispo: The patient is from: Home              Anticipated d/c is to: Home, benefit form SNF, family is trying to convince patient.               Patient currently is not medically stable to d/c.awaiting improved oral intake, correct electrolytes.    Difficult to place patient No        Consultants:   Oncology.   Procedures:   Ultrasound-guided lymph node biopsy  Antimicrobials:    Subjective: He appears very weak, sleepy. He relates he ate more breakfast today.  Denies abdominal pain  Objective: Vitals:   07/23/20 0502 07/23/20 0800 07/23/20 0805 07/23/20 1110  BP: 95/67 95/65 95/65  90/67  Pulse: 93 92 93 94  Resp: 18 16  18   Temp: 98.3 F (36.8 C) 97.7 F (36.5 C) 97.7 F (36.5 C) 99 F (37.2 C)  TempSrc: Oral Oral Oral Oral  SpO2: 93% 96% 96% 94%  Weight:      Height:        Intake/Output Summary (Last 24 hours) at 07/23/2020 1508 Last data filed at 07/23/2020 1348 Gross per 24 hour  Intake 1680.99 ml  Output 2025 ml  Net -  344.01 ml   Filed Weights   07/19/20 1755 07/21/20 1833  Weight: 72.6 kg 54.2 kg    Examination:  General exam: Chronic ill appearing.  Respiratory system: CTA Cardiovascular system: S 1, S 2 RRR Gastrointestinal system: BS present, soft , nt Central nervous system: alert,orieneted Extremities: no edema   Data Reviewed: I have personally reviewed following labs and imaging studies  CBC: Recent Labs  Lab 07/19/20 1751  07/20/20 1118 07/21/20 0626 07/22/20 0630 07/23/20 0503  WBC 2.6* 2.2* 2.0* 2.8* 2.4*  NEUTROABS  --  1.7 1.5* 2.3 1.9  HGB 10.1* 10.7* 8.9* 10.3* 8.9*  HCT 29.3* 31.2* 26.0* 29.4* 25.5*  MCV 87.7 88.6 89.7 87.5 88.2  PLT 87* 83* 71* 75* 66*   Basic Metabolic Panel: Recent Labs  Lab 07/19/20 1751 07/20/20 0607 07/21/20 0626 07/22/20 0630 07/23/20 0503  NA 129* 133* 136 137 133*  K 3.9 3.9 3.6 3.2* 3.3*  CL 93* 100 100 103 102  CO2 22 23 26 26 24   GLUCOSE 95 82 82 69* 70  BUN 61* 56* 43* 35* 27*  CREATININE 1.65* 1.41* 1.29* 1.23 0.96  CALCIUM 13.8* 12.9* 11.6* 10.2 9.5  MG  --   --  1.5* 1.7 1.3*   GFR: Estimated Creatinine Clearance: 55.7 mL/min (by C-G formula based on SCr of 0.96 mg/dL). Liver Function Tests: Recent Labs  Lab 07/20/20 1118 07/21/20 0626 07/22/20 0630 07/23/20 0503  AST 70* 61* 72* 66*  ALT 22 22 22 18   ALKPHOS 65 82 79 61  BILITOT 1.6* 1.3* 1.5* 1.1  PROT 5.0* 4.4* 4.7* 4.1*  ALBUMIN 3.0* 2.6* 2.8* 2.5*   Recent Labs  Lab 07/22/20 0630  LIPASE 234*  AMYLASE 200*   No results for input(s): AMMONIA in the last 168 hours. Coagulation Profile: No results for input(s): INR, PROTIME in the last 168 hours. Cardiac Enzymes: Recent Labs  Lab 07/19/20 1751  CKTOTAL 46*   BNP (last 3 results) No results for input(s): PROBNP in the last 8760 hours. HbA1C: No results for input(s): HGBA1C in the last 72 hours. CBG: Recent Labs  Lab 07/22/20 0956 07/22/20 1032 07/22/20 1052  GLUCAP 65* 68* 90   Lipid Profile: No results for input(s): CHOL, HDL, LDLCALC, TRIG, CHOLHDL, LDLDIRECT in the last 72 hours. Thyroid Function Tests: No results for input(s): TSH, T4TOTAL, FREET4, T3FREE, THYROIDAB in the last 72 hours. Anemia Panel: No results for input(s): VITAMINB12, FOLATE, FERRITIN, TIBC, IRON, RETICCTPCT in the last 72 hours. Sepsis Labs: No results for input(s): PROCALCITON, LATICACIDVEN in the last 168 hours.  Recent Results (from the  past 240 hour(s))  Resp Panel by RT-PCR (Flu A&B, Covid) Nasopharyngeal Swab     Status: None   Collection Time: 07/19/20  6:26 PM   Specimen: Nasopharyngeal Swab; Nasopharyngeal(NP) swabs in vial transport medium  Result Value Ref Range Status   SARS Coronavirus 2 by RT PCR NEGATIVE NEGATIVE Final    Comment: (NOTE) SARS-CoV-2 target nucleic acids are NOT DETECTED.  The SARS-CoV-2 RNA is generally detectable in upper respiratory specimens during the acute phase of infection. The lowest concentration of SARS-CoV-2 viral copies this assay can detect is 138 copies/mL. A negative result does not preclude SARS-Cov-2 infection and should not be used as the sole basis for treatment or other patient management decisions. A negative result may occur with  improper specimen collection/handling, submission of specimen other than nasopharyngeal swab, presence of viral mutation(s) within the areas targeted by this assay, and inadequate number of  viral copies(<138 copies/mL). A negative result must be combined with clinical observations, patient history, and epidemiological information. The expected result is Negative.  Fact Sheet for Patients:  EntrepreneurPulse.com.au  Fact Sheet for Healthcare Providers:  IncredibleEmployment.be  This test is no t yet approved or cleared by the [place] FDA and  has been authorized for detection and/or diagnosis of SARS-CoV-2 by FDA under an Emergency Use Authorization (EUA). This EUA will remain  in effect (meaning this test can be used) for the duration of the COVID-19 declaration under Section 564(b)(1) of the Act, 21 U.S.C.section 360bbb-3(b)(1), unless the authorization is terminated  or revoked sooner.       Influenza A by PCR NEGATIVE NEGATIVE Final   Influenza B by PCR NEGATIVE NEGATIVE Final    Comment: (NOTE) The Xpert Xpress SARS-CoV-2/FLU/RSV plus assay is intended as an aid in the diagnosis of  influenza from Nasopharyngeal swab specimens and should not be used as a sole basis for treatment. Nasal washings and aspirates are unacceptable for Xpert Xpress SARS-CoV-2/FLU/RSV testing.  Fact Sheet for Patients: EntrepreneurPulse.com.au  Fact Sheet for Healthcare Providers: IncredibleEmployment.be  This test is not yet approved or cleared by the [place] FDA and has been authorized for detection and/or diagnosis of SARS-CoV-2 by FDA under an Emergency Use Authorization (EUA). This EUA will remain in effect (meaning this test can be used) for the duration of the COVID-19 declaration under Section 564(b)(1) of the Act, 21 U.S.C. section 360bbb-3(b)(1), unless the authorization is terminated or revoked.  Performed at [facility], [address] [place], [place] [address]          Radiology Studies: No results found.      Scheduled Meds: . cholecalciferol  1,000 Units Oral Daily  . enoxaparin (LOVENOX) injection  40 mg Subcutaneous Q24H  . feeding supplement  237 mL Oral BID BM  . magnesium oxide  400 mg Oral BID  . nystatin  5 mL Oral QID  . pantoprazole (PROTONIX) IV  40 mg Intravenous Q12H  . potassium chloride  40 mEq Oral Q4H   Continuous Infusions: . dextrose 5% lactated ringers 100 mL/hr at 07/23/20 0449  . promethazine (PHENERGAN) injection (IM or IVPB)       LOS: 4 days    Time spent: 35 minutes    Baylea Milburn A Caidyn Henricksen, MD [facility]   If 7PM-7AM, please contact night-coverage [email or web address]  07/23/2020, 3:08 PM

## 2020-07-23 NOTE — TOC Progression Note (Addendum)
Transition of Care [facility]) - Progression Note    Patient Details  Name: Patrick Shepard MRN: [id number] Date of Birth: 1950/11/05  Transition of Care [facility]) CM/SW [place], LCSW Phone Number: 07/23/2020, 9:53 AM  Clinical Narrative:   Patient and sister declining SNF placement. Prefer to return home where sister will be staying with him. They are agreeable to home health but patient does not have a PCP. Unable to get him an appointment at [facility] as requested but got appointment at [place] with [place], [place] on 6/13. Information added to AVS. Told patient since there would not be a PCP to cover home health orders, he would likely have to request referral at his first appointment. Encouraged patient to work with PT, OT, and mobility tech as much as possible to build up strength prior to discharge.  11:56 am: Sister now trying to talk patient into SNF placement.  Expected Discharge Plan: Home/Self Care Barriers to Discharge: Continued Medical Work up  Expected Discharge Plan and Services Expected Discharge Plan: Home/Self Care     Post Acute Care Choice: NA Living arrangements for the past 2 months: [place]                                       Social Determinants of Health (SDOH) Interventions    Readmission Risk Interventions No flowsheet data found.

## 2020-07-23 NOTE — Progress Notes (Signed)
Mobility Specialist - Progress Note   07/23/20 1300  Mobility  Activity Refused mobility  Mobility performed by Mobility specialist    Pt sleeping on arrival, awakened by voice. Sister present at bedside. Pt declined mobility this date, no reason specified. Pt reports feeling ill and fatigue, requesting to hold off on mobility despite education and encouragement. Will attempt session another date/time as pt is agreeable.    Kathee Delton Mobility Specialist 07/23/20, 1:24 PM

## 2020-07-24 LAB — COMPREHENSIVE METABOLIC PANEL
ALT: 18 U/L (ref 0–44)
AST: 72 U/L — ABNORMAL HIGH (ref 15–41)
Albumin: 2.4 g/dL — ABNORMAL LOW (ref 3.5–5.0)
Alkaline Phosphatase: 66 U/L (ref 38–126)
Anion gap: 8 (ref 5–15)
BUN: 25 mg/dL — ABNORMAL HIGH (ref 8–23)
CO2: 24 mmol/L (ref 22–32)
Calcium: 8.9 mg/dL (ref 8.9–10.3)
Chloride: 100 mmol/L (ref 98–111)
Creatinine, Ser: 0.93 mg/dL (ref 0.61–1.24)
GFR, Estimated: >60 mL/min (ref >60)
Glucose, Bld: 72 mg/dL (ref 70–99)
Potassium: 3 mmol/L — ABNORMAL LOW (ref 3.5–5.1)
Sodium: 132 mmol/L — ABNORMAL LOW (ref 135–145)
Total Bilirubin: 1.2 mg/dL (ref 0.3–1.2)
Total Protein: 4 g/dL — ABNORMAL LOW (ref 6.5–8.1)

## 2020-07-24 LAB — PTH, INTACT AND CALCIUM
Calcium, Total (PTH): 11.1 mg/dL — ABNORMAL HIGH (ref 8.6–10.2)
PTH: 9 pg/mL — ABNORMAL LOW (ref 15–65)

## 2020-07-24 LAB — CBC WITH DIFFERENTIAL/PLATELET
Abs Immature Granulocytes: 0.03 10*3/uL (ref 0.00–0.07)
Basophils Absolute: 0 10*3/uL (ref 0.0–0.1)
Basophils Relative: 1 %
Eosinophils Absolute: 0 10*3/uL (ref 0.0–0.5)
Eosinophils Relative: 1 %
HCT: 24.8 % — ABNORMAL LOW (ref 39.0–52.0)
Hemoglobin: 8.7 g/dL — ABNORMAL LOW (ref 13.0–17.0)
Immature Granulocytes: 2 %
Lymphocytes Relative: 13 %
Lymphs Abs: 0.3 10*3/uL — ABNORMAL LOW (ref 0.7–4.0)
MCH: 31 pg (ref 26.0–34.0)
MCHC: 35.1 g/dL (ref 30.0–36.0)
MCV: 88.3 fL (ref 80.0–100.0)
Monocytes Absolute: 0.1 10*3/uL (ref 0.1–1.0)
Monocytes Relative: 7 %
Neutro Abs: 1.5 10*3/uL — ABNORMAL LOW (ref 1.7–7.7)
Neutrophils Relative %: 76 %
Platelets: 62 10*3/uL — ABNORMAL LOW (ref 150–400)
RBC: 2.81 MIL/uL — ABNORMAL LOW (ref 4.22–5.81)
RDW: 20.1 % — ABNORMAL HIGH (ref 11.5–15.5)
Smear Review: NORMAL
WBC: 2 10*3/uL — ABNORMAL LOW (ref 4.0–10.5)
nRBC: 0 % (ref 0.0–0.2)

## 2020-07-24 LAB — GLUCOSE, CAPILLARY
Glucose-Capillary: 95 mg/dL (ref 70–99)
Glucose-Capillary: 83 mg/dL (ref 70–99)
Glucose-Capillary: 137 mg/dL — ABNORMAL HIGH (ref 70–99)
Glucose-Capillary: 113 mg/dL — ABNORMAL HIGH (ref 70–99)
Glucose-Capillary: 96 mg/dL (ref 70–99)

## 2020-07-24 LAB — MAGNESIUM: Magnesium: 1.5 mg/dL — ABNORMAL LOW (ref 1.7–2.4)

## 2020-07-24 MED ORDER — POTASSIUM CHLORIDE CRYS ER 20 MEQ PO TBCR
40.0000 meq | EXTENDED_RELEASE_TABLET | ORAL | Status: AC
  Administered 2020-07-24 (×2): 40 meq via ORAL
  Filled 2020-07-24 (×2): qty 2

## 2020-07-24 MED ORDER — MAGNESIUM SULFATE 2 GM/50ML IV SOLN
2.0000 g | Freq: Once | INTRAVENOUS | Status: AC
  Administered 2020-07-24: 2 g via INTRAVENOUS
  Filled 2020-07-24: qty 50

## 2020-07-24 NOTE — Progress Notes (Addendum)
Physical Therapy Treatment Patient Details Name: Patrick Shepard MRN: [id number] DOB: 11-24-1950 Today's Date: 07/24/2020    History of Present Illness Patrick Shepard is a 70 y.o. male with no prior  medical history, hasn't seen a doctor for many years.  He presents to the emergency department for generalized weakness.  CT scan which showed approximately 1 cm left lower lobe lung nodule; along with bilateral axial adenopathy' mediastinal adenopathy; interstitial changes of the lung.  CT of the abdomen pelvis-extensive retroperitoneal adenopathy with splenomegaly.    PT Comments    Pt was sitting in recliner upon arriving. He is eager to return to bed. Pt was easily able to stand and ambulate ~ 12 ft with RW. He is HOH and needs increased time to comprehend desired task. Pt's supportive sister present throughout session. Per sister," I'm planning on taking him home (assited living) and having PT come out to house." Pt has been unwilling to go to rehab. Gait distance was limited by fatigue/ pt willingness.Overall pt is progressing. He will continue to benefit from skilled PT at DC to address strength and activity tolerance deficits. He was in bed with bed alarm set and sister at bedside at conclusion of session. Sister is planning on getting patient a PCP who can order [facility] services.    Follow Up Recommendations  Supervision/Assistance - 24 hour;Home health PT;Other (comment) ((Per sister) she will be staying with pt and able to assist at DC. Does not want to go to rehab. Highly recommend HHPT if pt does go straight home from acute hospital.)     Equipment Recommendations  Rolling walker with 5" wheels;3in1 (PT);Other (comment)       Precautions / Restrictions Precautions Precautions: Fall Restrictions Weight Bearing Restrictions: No    Mobility  Bed Mobility Overal bed mobility: Needs Assistance Bed Mobility: Sit to Supine     Supine to sit: Supervision Sit to supine:  Supervision   General bed mobility comments: pt was able to return to supine from short sit EOB without physical assistance    Transfers Overall transfer level: Needs assistance Equipment used: Rolling walker (2 wheeled) Transfers: Sit to/from Stand Sit to Stand: Min guard            Ambulation/Gait Ambulation/Gait assistance: Min guard Gait Distance (Feet): 12 Feet Assistive device: Rolling walker (2 wheeled) Gait Pattern/deviations: Step-through pattern Gait velocity: decreased   General Gait Details: limited distance per pt request.         Balance Overall balance assessment: Needs assistance Sitting-balance support: Feet supported Sitting balance-Leahy Scale: Good     Standing balance support: Bilateral upper extremity supported Standing balance-Leahy Scale: Good         Cognition Arousal/Alertness: Awake/alert Behavior During Therapy: WFL for tasks assessed/performed Overall Cognitive Status: Within Functional Limits for tasks assessed        General Comments: Pt is A but has congition deficits present. sister present throughout session and is extremely supportive             Pertinent Vitals/Pain Pain Assessment: No/denies pain           PT Goals (current goals can now be found in the care plan section) Acute Rehab PT Goals Patient Stated Goal: to go home Progress towards PT goals: Progressing toward goals    Frequency    Min 2X/week      PT Plan Current plan remains appropriate       AM-PAC PT "6 Clicks" Mobility   Outcome  Measure  Help needed turning from your back to your side while in a flat bed without using bedrails?: A Little Help needed moving from lying on your back to sitting on the side of a flat bed without using bedrails?: A Little Help needed moving to and from a bed to a chair (including a wheelchair)?: A Little Help needed standing up from a chair using your arms (e.g., wheelchair or bedside chair)?: A  Little Help needed to walk in hospital room?: A Little Help needed climbing 3-5 steps with a railing? : A Little 6 Click Score: 18    End of Session   Activity Tolerance: Patient tolerated treatment well;Patient limited by fatigue Patient left: in bed;with call bell/phone within reach;with bed alarm set;with family/visitor present Nurse Communication: Mobility status PT Visit Diagnosis: Muscle weakness (generalized) (M62.81);Difficulty in walking, not elsewhere classified (R26.2);Pain     Time: 8329-1916 PT Time Calculation (min) (ACUTE ONLY): 13 min  Charges:  $Therapeutic Activity: 8-22 mins                     Julaine Fusi PTA 07/24/20, 1:31 PM

## 2020-07-24 NOTE — Progress Notes (Signed)
PROGRESS NOTE    ZURI BRADWAY  IRC:[id number] DOB: [birth year] DOA: 07/19/2020 PCP: Pcp, No   Brief Narrative:  70 year old with past medical history significant for tobacco use who presented to the ER complaining of worsening weakness.  He has not seen medical care since 2007 due to an aversion to healthcare.  Patient has lost 20 to 30 pounds of weight since Christmas.  CT chest on admission showed left lower lobe opacity measuring 1.1 cm and enlarged axillary lymphadenopathy along with mediastinal lymphadenopathy.  He was admitted with hypercalcemia.  Started on IV fluids.  He underwent ultrasound-guided right axillary lymph node biopsy on 5/31st by IR.    Assessment & Plan:   Principal Problem:   Mass of lower lobe of left lung Active Problems:   Hypercalcemia   Emphysema lung (HCC)   Weight loss, abnormal   Pancytopenia (HCC)  1-Lung lesion: -Left lower lobe opacity measuring 1.1 cm with associated axillary and mediastinal lymphadenopathy. -Oncology  consulted and following -This CT abdomen and pelvis show extensive retroperitoneal lymphadenopathy and splenomegaly. -Awaiting lymph node biopsy results. Needs to follow up with Dr Rogue Bussing oncologist.   2-Hypercalcemia: Calcium on admission 13.8. -Received calcitonin and zoledronic acid on 5/30. -Also received IV fluids. -Serum calcium  has normalized. -Suspect related to malignancy  3-Pancytopenia, probably related to underlying malignancy, awaiting lymph node biopsy.  4-Hypoglycemia: Started on D5. Cortisol level normal.  Continue with IV fluids. Encourage oral intake.   5-oral thrush: Started on nystatin, improved 6-Mild pancreatitis. mild elevation of lipase: Supportive care. Tolerated diet.  FTT/weakness; might be related to underline malignancy.  Hypokalemia/ Hypomagnesemia; replete IV mag and oral potasium.   Tobacco Use: Nicotine patch as needed  Nutrition Problem: Increased nutrient needs Etiology:  cancer and cancer related treatments    Signs/Symptoms: estimated needs    Interventions: Ensure Enlive (each supplement provides 350kcal and 20 grams of protein)  Estimated body mass index is 18.17 kg/m as calculated from the following:   Height as of this encounter: 5\' 8"  (1.727 m).   Weight as of this encounter: 54.2 kg.   DVT prophylaxis: Lovenox Code Status: Full code Family Communication: Sister who was at bedside.  Disposition Plan:  Status is: Inpatient  Remains inpatient appropriate because:IV treatments appropriate due to intensity of illness or inability to take PO   Dispo: The patient is from: Home              Anticipated d/c is to: Home              Patient currently is not medically stable to d/c. plan to discharge tomorrow.    Difficult to place patient No        Consultants:   Oncology.   Procedures:   Ultrasound-guided lymph node biopsy  Antimicrobials:    Subjective: He is more alert and interactive He is eating more.   Objective: Vitals:   07/24/20 0018 07/24/20 0412 07/24/20 1104 07/24/20 1513  BP: (!) 82/56 109/65 98/66 99/64   Pulse: 88 94 97 99  Resp: 16 18 18 18   Temp: 98.7 F (37.1 C) 98.7 F (37.1 C)  98.7 F (37.1 C)  TempSrc: Oral Oral    SpO2: 94% 95% 98% 97%  Weight:      Height:        Intake/Output Summary (Last 24 hours) at 07/24/2020 1532 Last data filed at 07/24/2020 1300 Gross per 24 hour  Intake 2924.43 ml  Output 2100 ml  Net 824.43 ml  Filed Weights   07/19/20 1755 07/21/20 1833  Weight: 72.6 kg 54.2 kg    Examination:  General exam: Chronic ill appearing Respiratory system: CTA Cardiovascular system: S 1, S 2 RRR Gastrointestinal system: BS present, soft, nt Central nervous system: Alert Extremities: No edema   Data Reviewed: I have personally reviewed following labs and imaging studies  CBC: Recent Labs  Lab 07/20/20 1118 07/21/20 0626 07/22/20 0630 07/23/20 0503 07/24/20 0550  WBC  2.2* 2.0* 2.8* 2.4* 2.0*  NEUTROABS 1.7 1.5* 2.3 1.9 1.5*  HGB 10.7* 8.9* 10.3* 8.9* 8.7*  HCT 31.2* 26.0* 29.4* 25.5* 24.8*  MCV 88.6 89.7 87.5 88.2 88.3  PLT 83* 71* 75* 66* 62*   Basic Metabolic Panel: Recent Labs  Lab 07/20/20 0607 07/21/20 0626 07/22/20 0630 07/23/20 0503 07/24/20 0550  NA 133* 136 137 133* 132*  K 3.9 3.6 3.2* 3.3* 3.0*  CL 100 100 103 102 100  CO2 23 26 26 24 24   GLUCOSE 82 82 69* 70 72  BUN 56* 43* 35* 27* 25*  CREATININE 1.41* 1.29* 1.23 0.96 0.93  CALCIUM 12.9*  11.1* 11.6* 10.2 9.5 8.9  MG  --  1.5* 1.7 1.3* 1.5*   GFR: Estimated Creatinine Clearance: 57.5 mL/min (by C-G formula based on SCr of 0.93 mg/dL). Liver Function Tests: Recent Labs  Lab 07/20/20 1118 07/21/20 0626 07/22/20 0630 07/23/20 0503 07/24/20 0550  AST 70* 61* 72* 66* 72*  ALT 22 22 22 18 18   ALKPHOS 65 82 79 61 66  BILITOT 1.6* 1.3* 1.5* 1.1 1.2  PROT 5.0* 4.4* 4.7* 4.1* 4.0*  ALBUMIN 3.0* 2.6* 2.8* 2.5* 2.4*   Recent Labs  Lab 07/22/20 0630  LIPASE 234*  AMYLASE 200*   No results for input(s): AMMONIA in the last 168 hours. Coagulation Profile: No results for input(s): INR, PROTIME in the last 168 hours. Cardiac Enzymes: Recent Labs  Lab 07/19/20 1751  CKTOTAL 46*   BNP (last 3 results) No results for input(s): PROBNP in the last 8760 hours. HbA1C: No results for input(s): HGBA1C in the last 72 hours. CBG: Recent Labs  Lab 07/22/20 1032 07/22/20 1052 07/24/20 0820 07/24/20 1103 07/24/20 1512  GLUCAP 68* 90 95 83 137*   Lipid Profile: No results for input(s): CHOL, HDL, LDLCALC, TRIG, CHOLHDL, LDLDIRECT in the last 72 hours. Thyroid Function Tests: No results for input(s): TSH, T4TOTAL, FREET4, T3FREE, THYROIDAB in the last 72 hours. Anemia Panel: No results for input(s): VITAMINB12, FOLATE, FERRITIN, TIBC, IRON, RETICCTPCT in the last 72 hours. Sepsis Labs: No results for input(s): PROCALCITON, LATICACIDVEN in the last 168 hours.  Recent  Results (from the past 240 hour(s))  Resp Panel by RT-PCR (Flu A&B, Covid) Nasopharyngeal Swab     Status: None   Collection Time: 07/19/20  6:26 PM   Specimen: Nasopharyngeal Swab; Nasopharyngeal(NP) swabs in vial transport medium  Result Value Ref Range Status   SARS Coronavirus 2 by RT PCR NEGATIVE NEGATIVE Final    Comment: (NOTE) SARS-CoV-2 target nucleic acids are NOT DETECTED.  The SARS-CoV-2 RNA is generally detectable in upper respiratory specimens during the acute phase of infection. The lowest concentration of SARS-CoV-2 viral copies this assay can detect is 138 copies/mL. A negative result does not preclude SARS-Cov-2 infection and should not be used as the sole basis for treatment or other patient management decisions. A negative result may occur with  improper specimen collection/handling, submission of specimen other than nasopharyngeal swab, presence of viral mutation(s) within the areas targeted by  this assay, and inadequate number of viral copies(<138 copies/mL). A negative result must be combined with clinical observations, patient history, and epidemiological information. The expected result is Negative.  Fact Sheet for Patients:  EntrepreneurPulse.com.au  Fact Sheet for Healthcare Providers:  IncredibleEmployment.be  This test is no t yet approved or cleared by the [place] FDA and  has been authorized for detection and/or diagnosis of SARS-CoV-2 by FDA under an Emergency Use Authorization (EUA). This EUA will remain  in effect (meaning this test can be used) for the duration of the COVID-19 declaration under Section 564(b)(1) of the Act, 21 U.S.C.section 360bbb-3(b)(1), unless the authorization is terminated  or revoked sooner.       Influenza A by PCR NEGATIVE NEGATIVE Final   Influenza B by PCR NEGATIVE NEGATIVE Final    Comment: (NOTE) The Xpert Xpress SARS-CoV-2/FLU/RSV plus assay is intended as an aid in the  diagnosis of influenza from Nasopharyngeal swab specimens and should not be used as a sole basis for treatment. Nasal washings and aspirates are unacceptable for Xpert Xpress SARS-CoV-2/FLU/RSV testing.  Fact Sheet for Patients: EntrepreneurPulse.com.au  Fact Sheet for Healthcare Providers: IncredibleEmployment.be  This test is not yet approved or cleared by the [place] FDA and has been authorized for detection and/or diagnosis of SARS-CoV-2 by FDA under an Emergency Use Authorization (EUA). This EUA will remain in effect (meaning this test can be used) for the duration of the COVID-19 declaration under Section 564(b)(1) of the Act, 21 U.S.C. section 360bbb-3(b)(1), unless the authorization is terminated or revoked.  Performed at [facility], [address] [place], [place] [address]          Radiology Studies: No results found.      Scheduled Meds: . cholecalciferol  1,000 Units Oral Daily  . enoxaparin (LOVENOX) injection  40 mg Subcutaneous Q24H  . feeding supplement  237 mL Oral BID BM  . magnesium oxide  400 mg Oral BID  . nystatin  5 mL Oral QID  . pantoprazole (PROTONIX) IV  40 mg Intravenous Q12H   Continuous Infusions: . dextrose 5% lactated ringers 100 mL/hr at 07/24/20 1227  . promethazine (PHENERGAN) injection (IM or IVPB)       LOS: 5 days    Time spent: 35 minutes    Jaquetta Currier A Zaron Zwiefelhofer, MD [facility]   If 7PM-7AM, please contact night-coverage [email or web address]  07/24/2020, 3:32 PM

## 2020-07-24 NOTE — Plan of Care (Signed)
Continuing with plan of care. 

## 2020-07-24 NOTE — Progress Notes (Signed)
Mobility Specialist - Progress Note   07/24/20 1540  Mobility  Activity Refused mobility  Mobility performed by Mobility specialist    Pt politely declined mobility this date, no reason specified. Pt reports he had a good day with PT/OT sessions and would like to rest at this time. Will attempt session another date/time.    Kathee Delton Mobility Specialist 07/24/20, 3:41 PM

## 2020-07-24 NOTE — Progress Notes (Signed)
Occupational Therapy Treatment Patient Details Name: Patrick Shepard MRN: [id number] DOB: 1950/10/25 Today's Date: 07/24/2020    History of present illness ERIKSON DANZY is a 70 y.o. male with no prior  medical history, hasn't seen a doctor for many years.  He presents to the emergency department for generalized weakness.  CT scan which showed approximately 1 cm left lower lobe lung nodule; along with bilateral axial adenopathy' mediastinal adenopathy; interstitial changes of the lung.  CT of the abdomen pelvis-extensive retroperitoneal adenopathy with splenomegaly.   OT comments  Pt seen for OT tx this date. Pt in bed, family member in room. Pt agreeable to OT, denies pain. Pt educated in benefits of getting out of bed and moving his body to minimize decline in strength and activity tolerance. Pt verbalized understanding and agreeable to get out of bed and sit up in recliner through lunch. Family member present verbalized she would provide encouragement too to support plan. Pt required CGA for bed mobility and transfer with RW EOB. Took a few steps with CGA + RW to recliner. No LOB noted. Pt's ADL mobility improved from previous session. Continue to recommend short term rehab at this time 2/2 impairments. Will continue to reassess discharge recommendation as pt demonstrates continued progress towards OT goals.    Follow Up Recommendations  SNF;Supervision/Assistance - 24 hour    Equipment Recommendations  3 in 1 bedside commode    Recommendations for Other Services      Precautions / Restrictions Precautions Precautions: Fall Restrictions Weight Bearing Restrictions: No       Mobility Bed Mobility Overal bed mobility: Needs Assistance Bed Mobility: Supine to Sit     Supine to sit: Supervision     General bed mobility comments: increased time/effort but able to perform without assist    Transfers Overall transfer level: Needs assistance Equipment used: Rolling walker  (2 wheeled) Transfers: Sit to/from Stand Sit to Stand: Min guard              Balance Overall balance assessment: Needs assistance Sitting-balance support: Feet supported Sitting balance-Leahy Scale: Good     Standing balance support: Bilateral upper extremity supported Standing balance-Leahy Scale: Good                             ADL either performed or assessed with clinical judgement   ADL Overall ADL's : Needs assistance/impaired                                       General ADL Comments: Pt requires CGA-MIN A for LB ADL Tasks, CGA for ADL transfers     Vision Patient Visual Report: No change from baseline     Perception     Praxis      Cognition Arousal/Alertness: Awake/alert Behavior During Therapy: WFL for tasks assessed/performed Overall Cognitive Status: Within Functional Limits for tasks assessed                                          Exercises     Shoulder Instructions       General Comments      Pertinent Vitals/ Pain       Pain Assessment: No/denies pain  Home Living  Prior Functioning/Environment              Frequency  Min 2X/week        Progress Toward Goals  OT Goals(current goals can now be found in the care plan section)  Progress towards OT goals: Progressing toward goals  Acute Rehab OT Goals Patient Stated Goal: to go home OT Goal Formulation: With patient Time For Goal Achievement: 08/05/20 Potential to Achieve Goals: Good  Plan Frequency remains appropriate;Discharge plan remains appropriate    Co-evaluation                 AM-PAC OT "6 Clicks" Daily Activity     Outcome Measure   Help from another person eating meals?: None Help from another person taking care of personal grooming?: A Little Help from another person toileting, which includes using toliet, bedpan, or urinal?: A Little Help  from another person bathing (including washing, rinsing, drying)?: A Little Help from another person to put on and taking off regular upper body clothing?: A Little Help from another person to put on and taking off regular lower body clothing?: A Little 6 Click Score: 19    End of Session        Activity Tolerance Patient tolerated treatment well   Patient Left in chair;with call bell/phone within reach;with chair alarm set;with nursing/sitter in room;with family/visitor present (nurse tech taking vitals)   Nurse Communication Mobility status;Other (comment) (plan to stay up in recliner until after lunch)        Time: 5284-1324 OT Time Calculation (min): 16 min  Charges: OT General Charges $OT Visit: 1 Visit OT Treatments $Therapeutic Activity: 8-22 mins  Hanley Hays, MPH, MS, OTR/L ascom [phone] 07/24/20, 11:31 AM

## 2020-07-24 NOTE — TOC Progression Note (Signed)
Transition of Care [facility]) - Progression Note    Patient Details  Name: Patrick Shepard MRN: [id number] Date of Birth: 1950-09-23  Transition of Care [facility]) CM/SW [place], RN Phone Number: 07/24/2020, 2:19 PM  Clinical Narrative:  Called and spoke with sister, [name] about SNF placement. Ms. Lovena Le states they have decided for patient to discharge to his apartment with [facility] services.      Expected Discharge Plan: Home/Self Care Barriers to Discharge: Continued Medical Work up  Expected Discharge Plan and Services Expected Discharge Plan: Home/Self Care     Post Acute Care Choice: NA Living arrangements for the past 2 months: [place]                                       Social Determinants of Health (SDOH) Interventions    Readmission Risk Interventions No flowsheet data found.

## 2020-07-25 LAB — COMPREHENSIVE METABOLIC PANEL
ALT: 20 U/L (ref 0–44)
AST: 68 U/L — ABNORMAL HIGH (ref 15–41)
Albumin: 2.4 g/dL — ABNORMAL LOW (ref 3.5–5.0)
Alkaline Phosphatase: 65 U/L (ref 38–126)
Anion gap: 8 (ref 5–15)
BUN: 23 mg/dL (ref 8–23)
CO2: 24 mmol/L (ref 22–32)
Calcium: 8.8 mg/dL — ABNORMAL LOW (ref 8.9–10.3)
Chloride: 101 mmol/L (ref 98–111)
Creatinine, Ser: 0.93 mg/dL (ref 0.61–1.24)
GFR, Estimated: >60 mL/min (ref >60)
Glucose, Bld: 84 mg/dL (ref 70–99)
Potassium: 3.8 mmol/L (ref 3.5–5.1)
Sodium: 133 mmol/L — ABNORMAL LOW (ref 135–145)
Total Bilirubin: 1.1 mg/dL (ref 0.3–1.2)
Total Protein: 4.1 g/dL — ABNORMAL LOW (ref 6.5–8.1)

## 2020-07-25 LAB — MAGNESIUM
Magnesium: 1.6 mg/dL — ABNORMAL LOW (ref 1.7–2.4)
Magnesium: 2.5 mg/dL — ABNORMAL HIGH (ref 1.7–2.4)

## 2020-07-25 LAB — GLUCOSE, CAPILLARY
Glucose-Capillary: 84 mg/dL (ref 70–99)
Glucose-Capillary: 80 mg/dL (ref 70–99)
Glucose-Capillary: 100 mg/dL — ABNORMAL HIGH (ref 70–99)

## 2020-07-25 MED ORDER — MAGNESIUM SULFATE 2 GM/50ML IV SOLN
2.0000 g | Freq: Once | INTRAVENOUS | Status: AC
  Administered 2020-07-25: 2 g via INTRAVENOUS
  Filled 2020-07-25: qty 50

## 2020-07-25 MED ORDER — VITAMIN D3 25 MCG PO TABS: 1000.0000 [IU] | ORAL_TABLET | Freq: Every day | ORAL | 0 refills | Status: DC

## 2020-07-25 MED ORDER — PANTOPRAZOLE SODIUM 40 MG PO TBEC: 40.0000 mg | DELAYED_RELEASE_TABLET | Freq: Every day | ORAL | 1 refills | Status: DC

## 2020-07-25 MED ORDER — ENSURE ENLIVE PO LIQD: 237.0000 mL | Freq: Two times a day (BID) | ORAL | 12 refills | Status: AC

## 2020-07-25 MED ORDER — POTASSIUM CHLORIDE CRYS ER 20 MEQ PO TBCR: 20.0000 meq | EXTENDED_RELEASE_TABLET | Freq: Every day | ORAL | 0 refills | Status: DC

## 2020-07-25 MED ORDER — TRAZODONE HCL 50 MG PO TABS: 25.0000 mg | ORAL_TABLET | Freq: Every evening | ORAL | 1 refills | Status: DC | PRN

## 2020-07-25 MED ORDER — POTASSIUM CHLORIDE CRYS ER 20 MEQ PO TBCR
40.0000 meq | EXTENDED_RELEASE_TABLET | Freq: Once | ORAL | Status: AC
  Administered 2020-07-25: 40 meq via ORAL
  Filled 2020-07-25: qty 2

## 2020-07-25 MED ORDER — NYSTATIN 100000 UNIT/ML MT SUSP: 5.0000 mL | Freq: Four times a day (QID) | OROMUCOSAL | 0 refills | Status: DC

## 2020-07-25 MED ORDER — MAGNESIUM OXIDE -MG SUPPLEMENT 400 (240 MG) MG PO TABS: 400.0000 mg | ORAL_TABLET | Freq: Two times a day (BID) | ORAL | 1 refills | Status: DC

## 2020-07-25 NOTE — Discharge Summary (Addendum)
Physician Discharge Summary  ANUJ SUMMONS DXI:[id number] DOB: May 30, 1950 DOA: 07/19/2020  PCP: Pcp, No  Admit date: 07/19/2020 Discharge date: 07/25/2020  Admitted From: Home  Disposition:  Home  Recommendations for Outpatient Follow-up:  Follow up with PCP in 1-2 weeks Please obtain BMP/CBC in one week Needs to follow up with oncologist for results of lymph node biopsy result.  Needs further follow up for Lung Nodule.  Needs Mg level.   Home Health: yes  Discharge Condition: Stable.  CODE STATUS: Full code Diet recommendation: Heart Healthy  Brief/Interim Summary:   70 year old with past medical history significant for tobacco use who presented to the ER complaining of worsening weakness.  He has not seen medical care since 2007 due to an aversion to healthcare.  Patient has lost 20 to 30 pounds of weight since Christmas.  CT chest on admission showed left lower lobe opacity measuring 1.1 cm and enlarged axillary lymphadenopathy along with mediastinal lymphadenopathy.  He was admitted with hypercalcemia.  Started on IV fluids.  He underwent ultrasound-guided right axillary lymph node biopsy on 5/31st by IR.     1-Lung lesion: -Left lower lobe opacity measuring 1.1 cm with associated axillary and mediastinal lymphadenopathy. -Oncology  consulted and following -This CT abdomen and pelvis show extensive retroperitoneal lymphadenopathy and splenomegaly. -Awaiting lymph node biopsy results. Needs to follow up with Dr Rogue Bussing oncologist.    2-Hypercalcemia: Calcium on admission 13.8. -Received calcitonin and zoledronic acid on 5/30. -Also received IV fluids. -Serum calcium  has normalized. -Suspect related to malignancy   3-Pancytopenia, probably related to underlying malignancy, awaiting lymph node biopsy.   4-Hypoglycemia: Started on D5. Cortisol level normal.  Continue with IV fluids. Encourage oral intake.   Resolved.   5-Oral thrush: Started on nystatin,  improved  6-Mild pancreatitis. mild elevation of lipase: Supportive care. Tolerated diet.   FTT/weakness; might be related to underline malignancy.   Hypokalemia/ Hypomagnesemia; replete IV mag and oral potasium.   Plan to discharge on oral magnesium and potassium supplement at discharge.   Tobacco Use: Nicotine patch as needed   Nutrition Problem: Increased nutrient needs Etiology: cancer and cancer related treatments   Addendum.   Pathology report came back after patient was discharge on 6/16. Consistent with Large Cell Lymphoma.     Discharge Diagnoses:  Principal Problem:   Mass of lower lobe of left lung Active Problems:   Hypercalcemia   Emphysema lung (HCC)   Weight loss, abnormal   Pancytopenia ([place])    Discharge Instructions  Discharge Instructions     Diet - low sodium heart healthy   Complete by: As directed    Discharge wound care:   Complete by: As directed    See above   Increase activity slowly   Complete by: As directed       Allergies as of 07/25/2020   No Known Allergies      Medication List     TAKE these medications    feeding supplement Liqd Take 237 mLs by mouth 2 (two) times daily between meals.   magnesium oxide 400 (240 Mg) MG tablet Commonly known as: MAG-OX Take 1 tablet (400 mg total) by mouth 2 (two) times daily.   nystatin 100000 UNIT/ML suspension Commonly known as: MYCOSTATIN Take 5 mLs (500,000 Units total) by mouth 4 (four) times daily.   pantoprazole 40 MG tablet Commonly known as: Protonix Take 1 tablet (40 mg total) by mouth daily.   potassium chloride SA 20 MEQ tablet Commonly known  as: KLOR-CON Take 1 tablet (20 mEq total) by mouth daily.   traZODone 50 MG tablet Commonly known as: DESYREL Take 0.5 tablets (25 mg total) by mouth at bedtime as needed for sleep.   Vitamin D3 25 MCG tablet Commonly known as: Vitamin D Take 1 tablet (1,000 Units total) by mouth daily. Start taking on: July 26, 2020                Durable Medical Equipment  (From admission, onward)           Start     Ordered   07/24/20 1230  For home use only DME Walker rolling  Once       Question Answer Comment  Walker: With 5 Inch Wheels   Patient needs a walker to treat with the following condition Balance disorder      07/24/20 1229   07/24/20 1230  For home use only DME 3 n 1  Once        07/24/20 1229              Discharge Care Instructions  (From admission, onward)           Start     Ordered   07/25/20 0000  Discharge wound care:       Comments: See above   07/25/20 1010            Follow-up Information     Mechele Claude, FNP. Go on 08/03/2020.   Specialty: Family Medicine Why: New primary care appt. Be there at 9:45 for 10:15 appt in order to complete new patient paperwork. Please bring ID, insurance card, and medications in their bottles. Contact information: [address] [place] [address] [phone]         Cammie Sickle, MD Follow up in 1 week(s).   Specialties: Internal Medicine, Oncology Contact information: [place]  [phone]                No Known Allergies  Consultations: Oncologist    Procedures/Studies: CT ABDOMEN PELVIS WO CONTRAST  Result Date: 07/20/2020 CLINICAL DATA:  70 year old male with history of lymphadenopathy. EXAM: CT ABDOMEN AND PELVIS WITHOUT CONTRAST TECHNIQUE: Multidetector CT imaging of the abdomen and pelvis was performed following the standard protocol without IV contrast. COMPARISON:  No priors. FINDINGS: Lower chest: 1.6 x 1.2 cm left lower lobe pulmonary nodule (axial image 3 of series 4). Atherosclerotic calcifications in the descending thoracic aorta as well as the left main, left anterior descending and right coronary arteries. Mild calcifications of the aortic valve. Hepatobiliary: Low-attenuation lesions are noted in the liver, incompletely characterized on  today's non-contrast CT examination, but statistically likely to represent cysts, largest of which is in segment 4A (axial image 19 of series 2) measuring 4.0 x 2.6 cm. Amorphous intermediate attenuation material lying dependently in the gallbladder, likely to represent biliary sludge. Gallbladder is nearly decompressed. Pancreas: No definite pancreatic mass or peripancreatic fluid collections or inflammatory changes are noted on today's noncontrast CT examination. Spleen: Spleen is enlarged measuring 15.3 x 6.4 x 13.8 cm (estimated splenic volume of 665 mL) . Adrenals/Urinary Tract: 3 mm nonobstructive calculus in the interpolar collecting system of the left kidney. No additional calculi are noted within the right renal collecting system, along the course of either ureter, or within the lumen of the urinary bladder. No hydroureteronephrosis. Unenhanced appearance of the kidneys is otherwise unremarkable. Bilateral adrenal glands are normal in appearance. Urinary  bladder is normal in appearance. Stomach/Bowel: Unenhanced appearance of the stomach is normal. No pathologic dilatation of small bowel or colon. The appendix is not confidently identified and may be surgically absent. Regardless, there are no inflammatory changes noted adjacent to the cecum to suggest the presence of an acute appendicitis at this time. Vascular/Lymphatic: Aortic atherosclerosis. Multiple enlarged retroperitoneal lymph nodes, largest of which is in the left para-aortic nodal station adjacent to the left renal hilum (axial image 30 of series 3) measuring 1.8 x 1.5 cm. Reproductive: Prostate gland and seminal vesicles are unremarkable in appearance. Other: No significant volume of ascites.  No pneumoperitoneum. Musculoskeletal: There are no aggressive appearing lytic or blastic lesions noted in the visualized portions of the skeleton. Chronic appearing compression fracture of T12 with 25% loss of anterior vertebral body height. IMPRESSION: 1.  Extensive retroperitoneal lymphadenopathy. There is also splenomegaly. Clinical correlation for signs and symptoms of lymphoproliferative disorder is recommended. 2. 1.6 x 1.2 cm left lower lobe pulmonary nodule concerning for neoplasm. Consider one of the following in 3 months for both low-risk and high-risk individuals: (a) repeat chest CT or (b) follow-up PET-CT. This recommendation follows the consensus statement: Guidelines for Management of Incidental Pulmonary Nodules Detected on CT Images: From the Fleischner Society 2017; Radiology 2017; 284:228-243. 3. Probable biliary sludge lying dependently in the gallbladder. 4. Aortic atherosclerosis. 5. Additional incidental findings, as above. Electronically Signed   By: Vinnie Langton M.D.   On: 07/20/2020 15:23   CT Chest Wo Contrast  Result Date: 07/19/2020 CLINICAL DATA:  Dyspnea. Interstitial lung disease suspected. Altered mental status over the last couple of days. EXAM: CT CHEST WITHOUT CONTRAST TECHNIQUE: Multidetector CT imaging of the chest was performed following the standard protocol without IV contrast. COMPARISON:  Current and prior chest radiographs. FINDINGS: Cardiovascular: Heart normal in size. Coronary artery calcifications. Aortic atherosclerosis. Mediastinum/Nodes: Enlarged right axillary lymph nodes, largest measuring 1.3 cm in short axis. There are shoddy subcentimeter neck base lymph nodes. There are several prominent to mildly enlarged mediastinal lymph nodes. Azygos level right paratracheal node measures 1.3 cm in short axis. Subcarinal node measures 1.8 cm in short axis. Probable mildly enlarged inferior right hilar lymph node, 1.3 cm in short axis. Trachea and esophagus are unremarkable. Lungs/Pleura: Lungs demonstrate interstitial thickening peripheral cystic spaces, with a predominance in the mid to upper lungs. There is an ill-defined 1.1 cm nodular opacity in the left lower lobe, image 91, series 3. No convincing pneumonia. No  pulmonary edema. No pleural effusion or pneumothorax. Upper Abdomen: Enlarged spleen, 15 cm in greatest transverse dimension. Prominent to mildly enlarged lymph nodes in the upper abdomen, in the visualized retroperitoneum and along the Peri celiac chain. Low-density liver lesions consistent with cysts. Musculoskeletal: Skeletal structures are demineralized. There multiple vertebral compression fractures, T4, T8, T9 as well as L1, which appear chronic. No bone lesions. IMPRESSION: 1. Interstitial lung disease reflected by peripheral cystic change, interstitial thickening, areas of honeycombing and architectural distortion, with a mid to upper lung predominance. 2. 1.1 cm ill-defined nodular opacity in the left lower lobe. This could be inflammatory. Neoplastic disease is possible. Consider one of the following in 3 months for both low-risk and high-risk individuals: (a) repeat chest CT, (b) follow-up PET-CT, or (c) tissue sampling. This recommendation follows the consensus statement: Guidelines for Management of Incidental Pulmonary Nodules Detected on CT Images: From the Fleischner Society 2017; Radiology 2017; 284:228-243. No other findings in the lungs to suggest active inflammation or infection. No pulmonary  edema. 3. Enlarged lymph nodes, most apparent in the right axilla, milder adenopathy along the mediastinum and right hilum, with adenopathy also noted in the visualized upper abdomen. There is also splenomegaly. The combination of findings is concerning for a lymphoproliferative disorder including lymphoma. 4. Coronary artery calcifications and aortic atherosclerosis. Aortic Atherosclerosis (ICD10-I70.0). Electronically Signed   By: Lajean Manes M.D.   On: 07/19/2020 21:07   DG Chest Portable 1 View  Result Date: 07/19/2020 CLINICAL DATA:  Weakness. EXAM: PORTABLE CHEST 1 VIEW COMPARISON:  02/10/2011. FINDINGS: Cardiac silhouette is normal in size. Normal mediastinal and hilar contours. Lungs show  bilateral interstitial thickening similar to the prior exam allowing for differences in radiographic technique. No lung consolidation. No pleural effusion or pneumothorax. Skeletal structures are grossly intact. IMPRESSION: 1. No acute cardiopulmonary disease. 2. Chronic bilateral interstitial thickening. Electronically Signed   By: Lajean Manes M.D.   On: 07/19/2020 19:56   [place] CORE BIOPSY (LYMPH NODES)  Result Date: 07/21/2020 INDICATION: 70 year old male presenting with weight loss, left lower lobe mass, and lymphadenopathy. EXAM: ULTRASOUND GUIDED core BIOPSY OF right axillary lymph node. MEDICATIONS: None. ANESTHESIA/SEDATION: Fentanyl 25 mcg IV; Versed 0.5 mg IV Moderate Sedation Time:  7 The patient was continuously monitored during the procedure by the interventional radiology nurse under my direct supervision. PROCEDURE: The procedure, risks, benefits, and alternatives were explained to the patient. Questions regarding the procedure were encouraged and answered. The patient understands and consents to the procedure. The right axilla was prepped with chlorhexidine in a sterile fashion, and a sterile drape was applied covering the operative field. A sterile gown and sterile gloves were used for the procedure. Preprocedure ultrasound evaluation demonstrated multiple prominent right axillary lymph nodes. The dominant node was selected for biopsy. The procedure was planned. Local anesthesia was provided with 1% Lidocaine. A small skin nick was made. Under direct ultrasound visualization, a total of 3, 18 gauge core biopsies were obtained. The samples were placed on saline soaked Telfa and sent to Pathology. Postprocedural imaging demonstrated no evidence of surrounding hematoma. Sterile bandage was placed. The patient tolerated procedure well was transferred back to floor in stable condition. COMPLICATIONS: None immediate. FINDINGS: Right axillary lymphadenopathy. IMPRESSION: Technically successful  ultrasound-guided right axillary lymph node biopsy. Ruthann Cancer, MD [facility] Electronically Signed   By: Ruthann Cancer MD   On: 07/21/2020 10:49     Subjective: He is eating better.  He was sitting up in recliner.    Discharge Exam: Vitals:   07/25/20 0409 07/25/20 1134  BP: 105/75 91/63  Pulse: 96 97  Resp: 16 18  Temp: 99 F (37.2 C) 98.2 F (36.8 C)  SpO2: 96% 97%     General: Pt is alert, awake, not in acute distress Cardiovascular: RRR, S1/S2 +, no rubs, no gallops Respiratory: CTA bilaterally, no wheezing, no rhonchi Abdominal: Soft, NT, ND, bowel sounds + Extremities: no edema, no cyanosis    The results of significant diagnostics from this hospitalization (including imaging, microbiology, ancillary and laboratory) are listed below for reference.     Microbiology: Recent Results (from the past 240 hour(s))  Resp Panel by RT-PCR (Flu A&B, Covid) Nasopharyngeal Swab     Status: None   Collection Time: 07/19/20  6:26 PM   Specimen: Nasopharyngeal Swab; Nasopharyngeal(NP) swabs in vial transport medium  Result Value Ref Range Status   SARS Coronavirus 2 by RT PCR NEGATIVE NEGATIVE Final    Comment: (NOTE) SARS-CoV-2 target nucleic acids  are NOT DETECTED.  The SARS-CoV-2 RNA is generally detectable in upper respiratory specimens during the acute phase of infection. The lowest concentration of SARS-CoV-2 viral copies this assay can detect is 138 copies/mL. A negative result does not preclude SARS-Cov-2 infection and should not be used as the sole basis for treatment or other patient management decisions. A negative result may occur with  improper specimen collection/handling, submission of specimen other than nasopharyngeal swab, presence of viral mutation(s) within the areas targeted by this assay, and inadequate number of viral copies(<138 copies/mL). A negative result must be combined with clinical  observations, patient history, and epidemiological information. The expected result is Negative.  Fact Sheet for Patients:  EntrepreneurPulse.com.au  Fact Sheet for Healthcare Providers:  IncredibleEmployment.be  This test is no t yet approved or cleared by the [place] FDA and  has been authorized for detection and/or diagnosis of SARS-CoV-2 by FDA under an Emergency Use Authorization (EUA). This EUA will remain  in effect (meaning this test can be used) for the duration of the COVID-19 declaration under Section 564(b)(1) of the Act, 21 U.S.C.section 360bbb-3(b)(1), unless the authorization is terminated  or revoked sooner.       Influenza A by PCR NEGATIVE NEGATIVE Final   Influenza B by PCR NEGATIVE NEGATIVE Final    Comment: (NOTE) The Xpert Xpress SARS-CoV-2/FLU/RSV plus assay is intended as an aid in the diagnosis of influenza from Nasopharyngeal swab specimens and should not be used as a sole basis for treatment. Nasal washings and aspirates are unacceptable for Xpert Xpress SARS-CoV-2/FLU/RSV testing.  Fact Sheet for Patients: EntrepreneurPulse.com.au  Fact Sheet for Healthcare Providers: IncredibleEmployment.be  This test is not yet approved or cleared by the [place] FDA and has been authorized for detection and/or diagnosis of SARS-CoV-2 by FDA under an Emergency Use Authorization (EUA). This EUA will remain in effect (meaning this test can be used) for the duration of the COVID-19 declaration under Section 564(b)(1) of the Act, 21 U.S.C. section 360bbb-3(b)(1), unless the authorization is terminated or revoked.  Performed at [facility], [address] [place], [place] [address]      Labs: BNP (last 3 results) No results for input(s): BNP in the last 8760 hours. Basic Metabolic Panel: Recent Labs  Lab 07/21/20 0626 07/22/20 0630 07/23/20 0503 07/24/20 0550  07/25/20 0358 07/25/20 1118  NA 136 137 133* 132* 133*  --   K 3.6 3.2* 3.3* 3.0* 3.8  --   CL 100 103 102 100 101  --   CO2 26 26 24 24 24   --   GLUCOSE 82 69* 70 72 84  --   BUN 43* 35* 27* 25* 23  --   CREATININE 1.29* 1.23 0.96 0.93 0.93  --   CALCIUM 11.6* 10.2 9.5 8.9 8.8*  --   MG 1.5* 1.7 1.3* 1.5* 1.6* 2.5*   Liver Function Tests: Recent Labs  Lab 07/21/20 0626 07/22/20 0630 07/23/20 0503 07/24/20 0550 07/25/20 0358  AST 61* 72* 66* 72* 68*  ALT 22 22 18 18 20   ALKPHOS 82 79 61 66 65  BILITOT 1.3* 1.5* 1.1 1.2 1.1  PROT 4.4* 4.7* 4.1* 4.0* 4.1*  ALBUMIN 2.6* 2.8* 2.5* 2.4* 2.4*   Recent Labs  Lab 07/22/20 0630  LIPASE 234*  AMYLASE 200*   No results for input(s): AMMONIA in the last 168 hours. CBC: Recent Labs  Lab 07/20/20 1118 07/21/20 0626 07/22/20 0630 07/23/20 0503 07/24/20 0550  WBC 2.2* 2.0* 2.8* 2.4* 2.0*  NEUTROABS 1.7 1.5* 2.3 1.9 1.5*  HGB 10.7* 8.9* 10.3* 8.9* 8.7*  HCT 31.2* 26.0* 29.4* 25.5* 24.8*  MCV 88.6 89.7 87.5 88.2 88.3  PLT 83* 71* 75* 66* 62*   Cardiac Enzymes: Recent Labs  Lab 07/19/20 1751  CKTOTAL 46*   BNP: Invalid input(s): POCBNP CBG: Recent Labs  Lab 07/24/20 1919 07/24/20 2327 07/25/20 0405 07/25/20 0717 07/25/20 1131  GLUCAP 113* 96 84 80 100*   D-Dimer No results for input(s): DDIMER in the last 72 hours. Hgb A1c No results for input(s): HGBA1C in the last 72 hours. Lipid Profile No results for input(s): CHOL, HDL, LDLCALC, TRIG, CHOLHDL, LDLDIRECT in the last 72 hours. Thyroid function studies No results for input(s): TSH, T4TOTAL, T3FREE, THYROIDAB in the last 72 hours.  Invalid input(s): FREET3 Anemia work up No results for input(s): VITAMINB12, FOLATE, FERRITIN, TIBC, IRON, RETICCTPCT in the last 72 hours. Urinalysis    Component Value Date/Time   COLORURINE YELLOW (A) 07/19/2020 2044   APPEARANCEUR CLEAR (A) 07/19/2020 2044   LABSPEC 1.011 07/19/2020 2044   PHURINE 5.0 07/19/2020 2044    GLUCOSEU NEGATIVE 07/19/2020 2044   HGBUR SMALL (A) 07/19/2020 2044   BILIRUBINUR NEGATIVE 07/19/2020 2044   KETONESUR NEGATIVE 07/19/2020 2044   PROTEINUR NEGATIVE 07/19/2020 2044   NITRITE NEGATIVE 07/19/2020 2044   LEUKOCYTESUR NEGATIVE 07/19/2020 2044   Sepsis Labs Invalid input(s): PROCALCITONIN,  WBC,  LACTICIDVEN Microbiology Recent Results (from the past 240 hour(s))  Resp Panel by RT-PCR (Flu A&B, Covid) Nasopharyngeal Swab     Status: None   Collection Time: 07/19/20  6:26 PM   Specimen: Nasopharyngeal Swab; Nasopharyngeal(NP) swabs in vial transport medium  Result Value Ref Range Status   SARS Coronavirus 2 by RT PCR NEGATIVE NEGATIVE Final    Comment: (NOTE) SARS-CoV-2 target nucleic acids are NOT DETECTED.  The SARS-CoV-2 RNA is generally detectable in upper respiratory specimens during the acute phase of infection. The lowest concentration of SARS-CoV-2 viral copies this assay can detect is 138 copies/mL. A negative result does not preclude SARS-Cov-2 infection and should not be used as the sole basis for treatment or other patient management decisions. A negative result may occur with  improper specimen collection/handling, submission of specimen other than nasopharyngeal swab, presence of viral mutation(s) within the areas targeted by this assay, and inadequate number of viral copies(<138 copies/mL). A negative result must be combined with clinical observations, patient history, and epidemiological information. The expected result is Negative.  Fact Sheet for Patients:  EntrepreneurPulse.com.au  Fact Sheet for Healthcare Providers:  IncredibleEmployment.be  This test is no t yet approved or cleared by the [place] FDA and  has been authorized for detection and/or diagnosis of SARS-CoV-2 by FDA under an Emergency Use Authorization (EUA). This EUA will remain  in effect (meaning this test can be used) for the duration  of the COVID-19 declaration under Section 564(b)(1) of the Act, 21 U.S.C.section 360bbb-3(b)(1), unless the authorization is terminated  or revoked sooner.       Influenza A by PCR NEGATIVE NEGATIVE Final   Influenza B by PCR NEGATIVE NEGATIVE Final    Comment: (NOTE) The Xpert Xpress SARS-CoV-2/FLU/RSV plus assay is intended as an aid in the diagnosis of influenza from Nasopharyngeal swab specimens and should not be used as a sole basis for treatment. Nasal washings and aspirates are unacceptable for Xpert Xpress SARS-CoV-2/FLU/RSV testing.  Fact Sheet for Patients: EntrepreneurPulse.com.au  Fact Sheet for Healthcare Providers: IncredibleEmployment.be  This test is not yet  approved or cleared by the [place] and has been authorized for detection and/or diagnosis of SARS-CoV-2 by FDA under an Emergency Use Authorization (EUA). This EUA will remain in effect (meaning this test can be used) for the duration of the COVID-19 declaration under Section 564(b)(1) of the Act, 21 U.S.C. section 360bbb-3(b)(1), unless the authorization is terminated or revoked.  Performed at [facility], [address], [place], [place] [address]      Time coordinating discharge: 40 minutes  SIGNED:   Elmarie Shiley, MD  [facility]

## 2020-07-25 NOTE — Progress Notes (Signed)
Mobility Specialist - Progress Note   07/25/20 1200  Mobility  Range of Motion/Exercises Right leg;Left leg  Level of Assistance Standby assist, set-up cues, supervision of patient - no hands on  Assistive Device None  Distance Ambulated (ft) 0 ft  Mobility Response Tolerated well  Mobility performed by Mobility specialist  $Mobility charge 1 Mobility    Pt sitting in recliner, declined OOB activity. Participated in seated exercises: ankle pumps, straight leg raises, abduction, and heel slides. Tactile cues for proper technique.    Kathee Delton Mobility Specialist 07/25/20, 12:56 PM

## 2020-07-25 NOTE — Plan of Care (Signed)
Continuing with plan of care. 

## 2020-07-25 NOTE — Plan of Care (Signed)
Discharge teaching completed with patient who is in stable condition. 

## 2020-07-26 ENCOUNTER — Telehealth: Payer: Self-pay | Admitting: Internal Medicine

## 2020-07-26 DIAGNOSIS — R590 Localized enlarged lymph nodes: Secondary | ICD-10-CM

## 2020-07-26 LAB — PTH-RELATED PEPTIDE: PTH-related peptide: <2 pmol/L

## 2020-07-26 NOTE — Telephone Encounter (Signed)
M-please schedule hospital follow-up- on 6/09 at 1:30-MD; labs CBC CMP/LDH; possible Zometa.  C-schedule PET scan ASAP.   C-please forward this note to the nurse/lab orders.  C/M-patient has intellectual disabilities; sister-might be more reliable.  GB

## 2020-07-27 ENCOUNTER — Other Ambulatory Visit: Payer: Self-pay

## 2020-07-27 DIAGNOSIS — R590 Localized enlarged lymph nodes: Secondary | ICD-10-CM

## 2020-07-27 NOTE — Telephone Encounter (Signed)
Lab orders entered

## 2020-07-27 NOTE — Telephone Encounter (Signed)
PET Is for 6/9 @ 9:30 should be done before the appt

## 2020-07-30 ENCOUNTER — Other Ambulatory Visit: Payer: Self-pay

## 2020-07-30 ENCOUNTER — Inpatient Hospital Stay: Payer: Medicare Other

## 2020-07-30 ENCOUNTER — Inpatient Hospital Stay: Payer: Medicare Other | Attending: Internal Medicine | Admitting: Internal Medicine

## 2020-07-30 ENCOUNTER — Encounter: Admission: RE | Admit: 2020-07-30 | Payer: Medicare Other | Source: Ambulatory Visit

## 2020-07-30 VITALS — BP 107/60 | HR 100 | Resp 17

## 2020-07-30 DIAGNOSIS — F17210 Nicotine dependence, cigarettes, uncomplicated: Secondary | ICD-10-CM | POA: Diagnosis not present

## 2020-07-30 DIAGNOSIS — C8590 Non-Hodgkin lymphoma, unspecified, unspecified site: Secondary | ICD-10-CM | POA: Diagnosis not present

## 2020-07-30 DIAGNOSIS — I959 Hypotension, unspecified: Secondary | ICD-10-CM

## 2020-07-30 DIAGNOSIS — J849 Interstitial pulmonary disease, unspecified: Secondary | ICD-10-CM | POA: Diagnosis not present

## 2020-07-30 DIAGNOSIS — E861 Hypovolemia: Secondary | ICD-10-CM

## 2020-07-30 DIAGNOSIS — D61818 Other pancytopenia: Secondary | ICD-10-CM | POA: Insufficient documentation

## 2020-07-30 DIAGNOSIS — I9589 Other hypotension: Secondary | ICD-10-CM | POA: Insufficient documentation

## 2020-07-30 DIAGNOSIS — R161 Splenomegaly, not elsewhere classified: Secondary | ICD-10-CM | POA: Diagnosis not present

## 2020-07-30 DIAGNOSIS — R590 Localized enlarged lymph nodes: Secondary | ICD-10-CM

## 2020-07-30 LAB — CBC WITH DIFFERENTIAL/PLATELET
Abs Immature Granulocytes: 0.05 10*3/uL (ref 0.00–0.07)
Basophils Absolute: 0 10*3/uL (ref 0.0–0.1)
Basophils Relative: 0 %
Eosinophils Absolute: 0 10*3/uL (ref 0.0–0.5)
Eosinophils Relative: 0 %
HCT: 23.7 % — ABNORMAL LOW (ref 39.0–52.0)
Hemoglobin: 8.2 g/dL — ABNORMAL LOW (ref 13.0–17.0)
Immature Granulocytes: 2 %
Lymphocytes Relative: 15 %
Lymphs Abs: 0.4 10*3/uL — ABNORMAL LOW (ref 0.7–4.0)
MCH: 30.7 pg (ref 26.0–34.0)
MCHC: 34.6 g/dL (ref 30.0–36.0)
MCV: 88.8 fL (ref 80.0–100.0)
Monocytes Absolute: 0.2 10*3/uL (ref 0.1–1.0)
Monocytes Relative: 9 %
Neutro Abs: 1.8 10*3/uL (ref 1.7–7.7)
Neutrophils Relative %: 74 %
Platelets: 114 10*3/uL — ABNORMAL LOW (ref 150–400)
RBC: 2.67 MIL/uL — ABNORMAL LOW (ref 4.22–5.81)
RDW: 20.2 % — ABNORMAL HIGH (ref 11.5–15.5)
WBC: 2.4 10*3/uL — ABNORMAL LOW (ref 4.0–10.5)
nRBC: 0 % (ref 0.0–0.2)

## 2020-07-30 LAB — COMPREHENSIVE METABOLIC PANEL
ALT: 38 U/L (ref 0–44)
AST: 70 U/L — ABNORMAL HIGH (ref 15–41)
Albumin: 3 g/dL — ABNORMAL LOW (ref 3.5–5.0)
Alkaline Phosphatase: 101 U/L (ref 38–126)
Anion gap: 10 (ref 5–15)
BUN: 37 mg/dL — ABNORMAL HIGH (ref 8–23)
CO2: 31 mmol/L (ref 22–32)
Calcium: 12.7 mg/dL — ABNORMAL HIGH (ref 8.9–10.3)
Chloride: 87 mmol/L — ABNORMAL LOW (ref 98–111)
Creatinine, Ser: 1.24 mg/dL (ref 0.61–1.24)
GFR, Estimated: >60 mL/min (ref >60)
Glucose, Bld: 84 mg/dL (ref 70–99)
Potassium: 4.9 mmol/L (ref 3.5–5.1)
Sodium: 128 mmol/L — ABNORMAL LOW (ref 135–145)
Total Bilirubin: 0.9 mg/dL (ref 0.3–1.2)
Total Protein: 5.3 g/dL — ABNORMAL LOW (ref 6.5–8.1)

## 2020-07-30 LAB — LACTATE DEHYDROGENASE: LDH: 325 U/L — ABNORMAL HIGH (ref 98–192)

## 2020-07-30 LAB — MAGNESIUM: Magnesium: 1.8 mg/dL (ref 1.7–2.4)

## 2020-07-30 MED ORDER — ZOLEDRONIC ACID 4 MG/100ML IV SOLN
4.0000 mg | Freq: Once | INTRAVENOUS | Status: AC
  Administered 2020-07-30: 4 mg via INTRAVENOUS
  Filled 2020-07-30: qty 100

## 2020-07-30 MED ORDER — ALBUTEROL SULFATE HFA 108 (90 BASE) MCG/ACT IN AERS: 2.0000 | INHALATION_SPRAY | Freq: Four times a day (QID) | RESPIRATORY_TRACT | 2 refills | Status: DC | PRN

## 2020-07-30 MED ORDER — SODIUM CHLORIDE 0.9 % IV SOLN
Freq: Once | INTRAVENOUS | Status: AC
  Filled 2020-07-30: qty 250

## 2020-07-30 NOTE — Progress Notes (Signed)
[place] NOTE  Patient Care Team: Mechele Claude, FNP as PCP - General (Family Medicine)  CHIEF COMPLAINTS/PURPOSE OF CONSULTATION: Hypercalcemia/  #  Oncology History   No history exists.     HISTORY OF PRESENTING ILLNESS:  Patrick Shepard 70 y.o.  male was recently admitted hospital for pancytopenia/hypercalcemia.  Patient underwent right axillary lymph node biopsy.  Also underwent treatment with calcitonin and Zometa.  Patient is here for follow-up with his sister.   Patient complains of generalized weakness.  Denies any nausea vomiting.  Complains of shortness of breath on exertion.  Weight loss.  Complains of fatigue.  No fevers or chills.  Poor p.o. intake.  Review of Systems  Constitutional:  Positive for malaise/fatigue and weight loss. Negative for chills, diaphoresis and fever.  HENT:  Negative for nosebleeds and sore throat.   Eyes:  Negative for double vision.  Respiratory:  Positive for shortness of breath. Negative for cough, hemoptysis, sputum production and wheezing.   Cardiovascular:  Negative for chest pain, palpitations, orthopnea and leg swelling.  Gastrointestinal:  Negative for abdominal pain, blood in stool, constipation, diarrhea, heartburn, melena, nausea and vomiting.  Genitourinary:  Negative for dysuria, frequency and urgency.  Musculoskeletal:  Positive for back pain. Negative for joint pain.  Skin: Negative.  Negative for itching and rash.  Neurological:  Positive for dizziness and weakness. Negative for tingling, focal weakness and headaches.  Endo/Heme/Allergies:  Does not bruise/bleed easily.  Psychiatric/Behavioral:  Negative for depression. The patient is not nervous/anxious and does not have insomnia.     MEDICAL HISTORY:  Past Medical History:  Diagnosis Date  . 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 Tobacco abuse     SURGICAL HISTORY: Past Surgical History:  Procedure Laterality Date   . KNEE SURGERY Left    for injury. Did not have knee replacement  . LAPAROTOMY     done at age 27 for intestinal issues    SOCIAL HISTORY: Social History   Socioeconomic History  . Marital status: Single    Spouse name: Not on file  . Number of children: Not on file  . Years of education: Not on file  . Highest education level: Not on file  Occupational History  . Not on file  Tobacco Use  . Smoking status: Every Day    Packs/day: 1.00    Pack years: 0.00    Types: Cigarettes  . Smokeless tobacco: Former  Media planner  . Vaping Use: Never used  Substance and Sexual Activity  . Alcohol use: Not on file  . Drug use: Not on file  . Sexual activity: Not on file  Other Topics Concern  . Not on file  Social History Narrative  . Not on file   Social Determinants of Health   Financial Resource Strain: Not on file  Food Insecurity: Not on file  Transportation Needs: Not on file  Physical Activity: Not on file  Stress: Not on file  Social Connections: Not on file  Intimate Partner Violence: Not on file    FAMILY HISTORY: Family History  Problem Relation Age of Onset  . COPD Mother   . Alcoholism Father   . Cirrhosis Father     ALLERGIES:  has No Known Allergies.  MEDICATIONS:  Current Outpatient Medications  Medication Sig Dispense Refill  . albuterol (VENTOLIN HFA) 108 (90 Base) MCG/ACT inhaler Inhale 2 puffs into the lungs every 6 (six) hours as  needed for wheezing or shortness of breath. 1 each 2  . calcium-vitamin D (OSCAL WITH D) 500-200 MG-UNIT tablet Take 1 tablet by mouth.    . magnesium oxide (MAG-OX) 400 MG tablet Take 1 tablet by mouth 2 (two) times daily.    . pantoprazole (PROTONIX) 40 MG tablet Take 1 tablet (40 mg total) by mouth daily. 30 tablet 1  . potassium chloride SA (KLOR-CON) 20 MEQ tablet Take 1 tablet (20 mEq total) by mouth daily. 20 tablet 0  . traZODone (DESYREL) 50 MG tablet Take 0.5 tablets (25 mg total) by mouth at bedtime as needed  for sleep. 30 tablet 1  . feeding supplement (ENSURE ENLIVE / ENSURE PLUS) LIQD Take 237 mLs by mouth 2 (two) times daily between meals. 237 mL 12  . magnesium oxide (MAG-OX) 400 (240 Mg) MG tablet Take 1 tablet (400 mg total) by mouth 2 (two) times daily. (Patient not taking: No sig reported) 60 tablet 1  . nystatin (MYCOSTATIN) 100000 UNIT/ML suspension Take 5 mLs (500,000 Units total) by mouth 4 (four) times daily. (Patient not taking: No sig reported) 60 mL 0   No current facility-administered medications for this visit.      Marland Kitchen  PHYSICAL EXAMINATION: ECOG PERFORMANCE STATUS: 2 - Symptomatic, <50% confined to bed  Vitals:   07/30/20 1426  BP: (!) 81/59  Pulse: (!) 108  Temp: (!) 97.5 F (36.4 C)  SpO2: 95%   Filed Weights   07/30/20 1426  Weight: 141 lb 12.8 oz (64.3 kg)    Physical Exam Constitutional:      Comments: Frail-appearing Caucasian male patient.  In a wheelchair.  Accompanied by family/sister.  HENT:     Head: Normocephalic and atraumatic.     Mouth/Throat:     Pharynx: No oropharyngeal exudate.  Eyes:     Pupils: Pupils are equal, round, and reactive to light.  Cardiovascular:     Rate and Rhythm: Normal rate and regular rhythm.  Pulmonary:     Effort: No respiratory distress.     Breath sounds: No wheezing.     Comments: Decreased breath sounds bilaterally at bases.  No wheeze or crackles Abdominal:     General: Bowel sounds are normal. There is no distension.     Palpations: Abdomen is soft. There is no mass.     Tenderness: no abdominal tenderness There is no guarding or rebound.  Musculoskeletal:        General: No tenderness. Normal range of motion.     Cervical back: Normal range of motion and neck supple.  Skin:    General: Skin is warm.  Neurological:     Mental Status: He is alert and oriented to person, place, and time.  Psychiatric:        Mood and Affect: Affect normal.     LABORATORY DATA:  I have reviewed the data as listed Lab  Results  Component Value Date   WBC 2.4 (L) 07/30/2020   HGB 8.2 (L) 07/30/2020   HCT 23.7 (L) 07/30/2020   MCV 88.8 07/30/2020   PLT 114 (L) 07/30/2020   Recent Labs    07/20/20 1118 07/21/20 0626 07/24/20 0550 07/25/20 0358 07/30/20 1523 07/31/20 0929 08/03/20 1143  NA  --    < > 132* 133* 128* 130* 128*  K  --    < > 3.0* 3.8 4.9 4.3 4.4  CL  --    < > 100 101 87* 90* 90*  CO2  --    < >  24 24 31 29 30   GLUCOSE  --    < > 72 84 84 93 103*  BUN  --    < > 25* 23 37* 33* 37*  CREATININE  --    < > 0.93 0.93 1.24 1.08 1.46*  CALCIUM  --    < > 8.9 8.8* 12.7* 11.8* 12.3*  GFRNONAA  --    < > >60 >60 >60 >60 52*  PROT 5.0*   < > 4.0* 4.1* 5.3*  --   --   ALBUMIN 3.0*   < > 2.4* 2.4* 3.0*  --   --   AST 70*   < > 72* 68* 70*  --   --   ALT 22   < > 18 20 38  --   --   ALKPHOS 65   < > 66 65 101  --   --   BILITOT 1.6*   < > 1.2 1.1 0.9  --   --   BILIDIR 0.5*  --   --   --   --   --   --   IBILI 1.1*  --   --   --   --   --   --    < > = values in this interval not displayed.    RADIOGRAPHIC STUDIES: I have personally reviewed the radiological images as listed and agreed with the findings in the report. CT ABDOMEN PELVIS WO CONTRAST  Result Date: 07/20/2020 CLINICAL DATA:  70 year old male with history of lymphadenopathy. EXAM: CT ABDOMEN AND PELVIS WITHOUT CONTRAST TECHNIQUE: Multidetector CT imaging of the abdomen and pelvis was performed following the standard protocol without IV contrast. COMPARISON:  No priors. FINDINGS: Lower chest: 1.6 x 1.2 cm left lower lobe pulmonary nodule (axial image 3 of series 4). Atherosclerotic calcifications in the descending thoracic aorta as well as the left main, left anterior descending and right coronary arteries. Mild calcifications of the aortic valve. Hepatobiliary: Low-attenuation lesions are noted in the liver, incompletely characterized on today's non-contrast CT examination, but statistically likely to represent cysts, largest of  which is in segment 4A (axial image 19 of series 2) measuring 4.0 x 2.6 cm. Amorphous intermediate attenuation material lying dependently in the gallbladder, likely to represent biliary sludge. Gallbladder is nearly decompressed. Pancreas: No definite pancreatic mass or peripancreatic fluid collections or inflammatory changes are noted on today's noncontrast CT examination. Spleen: Spleen is enlarged measuring 15.3 x 6.4 x 13.8 cm (estimated splenic volume of 665 mL) . Adrenals/Urinary Tract: 3 mm nonobstructive calculus in the interpolar collecting system of the left kidney. No additional calculi are noted within the right renal collecting system, along the course of either ureter, or within the lumen of the urinary bladder. No hydroureteronephrosis. Unenhanced appearance of the kidneys is otherwise unremarkable. Bilateral adrenal glands are normal in appearance. Urinary bladder is normal in appearance. Stomach/Bowel: Unenhanced appearance of the stomach is normal. No pathologic dilatation of small bowel or colon. The appendix is not confidently identified and may be surgically absent. Regardless, there are no inflammatory changes noted adjacent to the cecum to suggest the presence of an acute appendicitis at this time. Vascular/Lymphatic: Aortic atherosclerosis. Multiple enlarged retroperitoneal lymph nodes, largest of which is in the left para-aortic nodal station adjacent to the left renal hilum (axial image 30 of series 3) measuring 1.8 x 1.5 cm. Reproductive: Prostate gland and seminal vesicles are unremarkable in appearance. Other: No significant volume of ascites.  No pneumoperitoneum. Musculoskeletal: There  are no aggressive appearing lytic or blastic lesions noted in the visualized portions of the skeleton. Chronic appearing compression fracture of T12 with 25% loss of anterior vertebral body height. IMPRESSION: 1. Extensive retroperitoneal lymphadenopathy. There is also splenomegaly. Clinical correlation  for signs and symptoms of lymphoproliferative disorder is recommended. 2. 1.6 x 1.2 cm left lower lobe pulmonary nodule concerning for neoplasm. Consider one of the following in 3 months for both low-risk and high-risk individuals: (a) repeat chest CT or (b) follow-up PET-CT. This recommendation follows the consensus statement: Guidelines for Management of Incidental Pulmonary Nodules Detected on CT Images: From the Fleischner Society 2017; Radiology 2017; 284:228-243. 3. Probable biliary sludge lying dependently in the gallbladder. 4. Aortic atherosclerosis. 5. Additional incidental findings, as above. Electronically Signed   By: Vinnie Langton M.D.   On: 07/20/2020 15:23   CT Chest Wo Contrast  Result Date: 07/19/2020 CLINICAL DATA:  Dyspnea. Interstitial lung disease suspected. Altered mental status over the last couple of days. EXAM: CT CHEST WITHOUT CONTRAST TECHNIQUE: Multidetector CT imaging of the chest was performed following the standard protocol without IV contrast. COMPARISON:  Current and prior chest radiographs. FINDINGS: Cardiovascular: Heart normal in size. Coronary artery calcifications. Aortic atherosclerosis. Mediastinum/Nodes: Enlarged right axillary lymph nodes, largest measuring 1.3 cm in short axis. There are shoddy subcentimeter neck base lymph nodes. There are several prominent to mildly enlarged mediastinal lymph nodes. Azygos level right paratracheal node measures 1.3 cm in short axis. Subcarinal node measures 1.8 cm in short axis. Probable mildly enlarged inferior right hilar lymph node, 1.3 cm in short axis. Trachea and esophagus are unremarkable. Lungs/Pleura: Lungs demonstrate interstitial thickening peripheral cystic spaces, with a predominance in the mid to upper lungs. There is an ill-defined 1.1 cm nodular opacity in the left lower lobe, image 91, series 3. No convincing pneumonia. No pulmonary edema. No pleural effusion or pneumothorax. Upper Abdomen: Enlarged spleen, 15 cm  in greatest transverse dimension. Prominent to mildly enlarged lymph nodes in the upper abdomen, in the visualized retroperitoneum and along the Peri celiac chain. Low-density liver lesions consistent with cysts. Musculoskeletal: Skeletal structures are demineralized. There multiple vertebral compression fractures, T4, T8, T9 as well as L1, which appear chronic. No bone lesions. IMPRESSION: 1. Interstitial lung disease reflected by peripheral cystic change, interstitial thickening, areas of honeycombing and architectural distortion, with a mid to upper lung predominance. 2. 1.1 cm ill-defined nodular opacity in the left lower lobe. This could be inflammatory. Neoplastic disease is possible. Consider one of the following in 3 months for both low-risk and high-risk individuals: (a) repeat chest CT, (b) follow-up PET-CT, or (c) tissue sampling. This recommendation follows the consensus statement: Guidelines for Management of Incidental Pulmonary Nodules Detected on CT Images: From the Fleischner Society 2017; Radiology 2017; 284:228-243. No other findings in the lungs to suggest active inflammation or infection. No pulmonary edema. 3. Enlarged lymph nodes, most apparent in the right axilla, milder adenopathy along the mediastinum and right hilum, with adenopathy also noted in the visualized upper abdomen. There is also splenomegaly. The combination of findings is concerning for a lymphoproliferative disorder including lymphoma. 4. Coronary artery calcifications and aortic atherosclerosis. Aortic Atherosclerosis (ICD10-I70.0). Electronically Signed   By: Lajean Manes M.D.   On: 07/19/2020 21:07   DG Chest Portable 1 View  Result Date: 07/19/2020 CLINICAL DATA:  Weakness. EXAM: PORTABLE CHEST 1 VIEW COMPARISON:  02/10/2011. FINDINGS: Cardiac silhouette is normal in size. Normal mediastinal and hilar contours. Lungs show bilateral interstitial thickening similar to  the prior exam allowing for differences in  radiographic technique. No lung consolidation. No pleural effusion or pneumothorax. Skeletal structures are grossly intact. IMPRESSION: 1. No acute cardiopulmonary disease. 2. Chronic bilateral interstitial thickening. Electronically Signed   By: Lajean Manes M.D.   On: 07/19/2020 19:56   [place] CORE BIOPSY (LYMPH NODES)  Result Date: 07/21/2020 INDICATION: 70 year old male presenting with weight loss, left lower lobe mass, and lymphadenopathy. EXAM: ULTRASOUND GUIDED core BIOPSY OF right axillary lymph node. MEDICATIONS: None. ANESTHESIA/SEDATION: Fentanyl 25 mcg IV; Versed 0.5 mg IV Moderate Sedation Time:  7 The patient was continuously monitored during the procedure by the interventional radiology nurse under my direct supervision. PROCEDURE: The procedure, risks, benefits, and alternatives were explained to the patient. Questions regarding the procedure were encouraged and answered. The patient understands and consents to the procedure. The right axilla was prepped with chlorhexidine in a sterile fashion, and a sterile drape was applied covering the operative field. A sterile gown and sterile gloves were used for the procedure. Preprocedure ultrasound evaluation demonstrated multiple prominent right axillary lymph nodes. The dominant node was selected for biopsy. The procedure was planned. Local anesthesia was provided with 1% Lidocaine. A small skin nick was made. Under direct ultrasound visualization, a total of 3, 18 gauge core biopsies were obtained. The samples were placed on saline soaked Telfa and sent to Pathology. Postprocedural imaging demonstrated no evidence of surrounding hematoma. Sterile bandage was placed. The patient tolerated procedure well was transferred back to floor in stable condition. COMPLICATIONS: None immediate. FINDINGS: Right axillary lymphadenopathy. IMPRESSION: Technically successful ultrasound-guided right axillary lymph node biopsy. 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  See discussion below  #Lymph node biopsy [right underarm core biopsy]-positive for lymphoma.  Discussed with Dr.Kraynie.  However unfortunately unable to subclassify further at this time.  Awaiting second opinion at tertiary center.  Patient missed PET scan appointment this morning-we will need to reschedule outpatient.  #Borderline low blood pressures 55D to 32K systolic-likely secondary to hypercalcemia/dehydration; as above.  See discussion below.   # Pancytopenia/splenomegaly/generalized lymphadenopathy--again concerning for lymphoma.  Await above work-up.   #Interstitial lung disease based on imaging / lung nodule ~1.1cm LLL/active smoker. Out pt follow up.  DISPOSITION: # IVFs; dex;zometa- # follow up TBD- Dr.B  #I reviewed the labs with the patient and his sister, [name]; also discussed with Judeen Hammans that the sister over the phone number concerns of progressive lymphoma/given the hypercalcemia.  Recommended the best option would be admission to the hospital for further treatment.  Patient reluctant/declines therapy.  We will try close monitoring outpatient with IV fluids/plan for tomorrow.  # 40 minutes face-to-face with the patient discussing the above plan of care; more than 50% of time spent on prognosis/ natural history; counseling and coordination.    All questions were answered. The patient knows to call the clinic with any problems, questions or concerns.       Cammie Sickle, MD 08/03/2020 12:56 PM

## 2020-07-30 NOTE — Progress Notes (Signed)
Pt states his left shoulder has been in pain for about a week. Has tried icy-hot, and heating pad but does not seem to be working. Took tylenol last night, helped some but not much. When he tries lifting it alone it causes pain and has to use his other arm to lift it up.

## 2020-07-30 NOTE — Assessment & Plan Note (Addendum)
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  See discussion below  #Lymph node biopsy [right underarm core biopsy]-positive for lymphoma.  Discussed with Dr.Kraynie.  However unfortunately unable to subclassify further at this time.  Awaiting second opinion at tertiary center.  Patient missed PET scan appointment this morning-we will need to reschedule outpatient.  #Borderline low blood pressures 44Z to 14U systolic-likely secondary to hypercalcemia/dehydration; as above.  See discussion below.   # Pancytopenia/splenomegaly/generalized lymphadenopathy--again concerning for lymphoma.  Await above work-up.   #Interstitial lung disease based on imaging / lung nodule ~1.1cm LLL/active smoker. Out pt follow up.  DISPOSITION: # IVFs; dex;zometa- # follow up TBD- Dr.B  #I reviewed the labs with the patient and his sister, [name]; also discussed with Judeen Hammans that the sister over the phone number concerns of progressive lymphoma/given the hypercalcemia.  Recommended the best option would be admission to the hospital for further treatment.  Patient reluctant/declines therapy.  We will try close monitoring outpatient with IV fluids/plan for tomorrow.  # 40 minutes face-to-face with the patient discussing the above plan of care; more than 50% of time spent on prognosis/ natural history; counseling and coordination.

## 2020-07-30 NOTE — Progress Notes (Signed)
Pt received hydration and Zometa infusion. Per daughter and pt's request PIV cath in Right wrist will be saline locked and taped and wrapped over night since pt is coming back in the morning for more IVF's. BP at discharge 107/60. Discharged to home accompanied by his sister.

## 2020-07-30 NOTE — Patient Instructions (Signed)
Dehydration, Adult Dehydration is condition in which there is not enough water or other fluids in the body. This happens when a person loses more fluids than he or she takes in. Important body parts cannot work right without the right amount of fluids. Anyloss of fluids from the body can cause dehydration. Dehydration can be mild, worse, or very bad. It should be treated right away tokeep it from getting very bad. What are the causes? This condition may be caused by: Conditions that cause loss of water or other fluids, such as: Watery poop (diarrhea). Vomiting. Sweating a lot. Peeing (urinating) a lot. Not drinking enough fluids, especially when you: Are ill. Are doing things that take a lot of energy to do. Other illnesses and conditions, such as fever or infection. Certain medicines, such as medicines that take extra fluid out of the body (diuretics). Lack of safe drinking water. Not being able to get enough water and food. What increases the risk? The following factors may make you more likely to develop this condition: Having a long-term (chronic) illness that has not been treated the right way, such as: Diabetes. Heart disease. Kidney disease. Being 106 years of age or older. Having a disability. Living in a place that is high above the ground or sea (high in altitude). The thinner, dried air causes more fluid loss. Doing exercises that put stress on your body for a long time. What are the signs or symptoms? Symptoms of dehydration depend on how bad it is. Mild or worse dehydration Thirst. Dry lips or dry mouth. Feeling dizzy or light-headed, especially when you stand up from sitting. Muscle cramps. Your body making: Dark pee (urine). Pee may be the color of tea. Less pee than normal. Less tears than normal. Headache. Very bad dehydration Changes in skin. Skin may: Be cold to the touch (clammy). Be blotchy or pale. Not go back to normal right after you lightly pinch it  and let it go. Little or no tears, pee, or sweat. Changes in vital signs, such as: Fast breathing. Low blood pressure. Weak pulse. Pulse that is more than 100 beats a minute when you are sitting still. Other changes, such as: Feeling very thirsty. Eyes that look hollow (sunken). Cold hands and feet. Being mixed up (confused). Being very tired (lethargic) or having trouble waking from sleep. Short-term weight loss. Loss of consciousness. How is this treated? Treatment for this condition depends on how bad it is. Treatment should start right away. Do not wait until your condition gets very bad. Very bad dehydration is an emergency. You will need to go to a hospital. Mild or worse dehydration can be treated at home. You may be asked to: Drink more fluids. Drink an oral rehydration solution (ORS). This drink helps get the right amounts of fluids and salts and minerals in the blood (electrolytes). Very bad dehydration can be treated: With fluids through an IV tube. By getting normal levels of salts and minerals in your blood. This is often done by giving salts and minerals through a tube. The tube is passed through your nose and into your stomach. By treating the root cause. Follow these instructions at home: Oral rehydration solution If told by your doctor, drink an ORS: Make an ORS. Use instructions on the package. Start by drinking small amounts, about  cup (120 mL) every 5-10 minutes. Slowly drink more until you have had the amount that your doctor said to have. Eating and drinking  Drink enough clear fluid to keep your pee pale yellow. If you were told to drink an ORS, finish the ORS first. Then, start slowly drinking other clear fluids. Drink fluids such as: Water. Do not drink only water. Doing that can make the salt (sodium) level in your body get too low. Water from ice chips you suck on. Fruit juice that you have added water to (diluted). Low-calorie sports  drinks. Eat foods that have the right amounts of salts and minerals, such as: Bananas. Oranges. Potatoes. Tomatoes. Spinach. Do not drink alcohol. Avoid: Drinks that have a lot of sugar. These include: High-calorie sports drinks. Fruit juice that you did not add water to. Soda. Caffeine. Foods that are greasy or have a lot of fat or sugar. General instructions Take over-the-counter and prescription medicines only as told by your doctor. Do not take salt tablets. Doing that can make the salt level in your body get too high. Return to your normal activities as told by your doctor. Ask your doctor what activities are safe for you. Keep all follow-up visits as told by your doctor. This is important. Contact a doctor if: You have pain in your belly (abdomen) and the pain: Gets worse. Stays in one place. You have a rash. You have a stiff neck. You get angry or annoyed (irritable) more easily than normal. You are more tired or have a harder time waking than normal. You feel: Weak or dizzy. Very thirsty. Get help right away if you have: Any symptoms of very bad dehydration. Symptoms of vomiting, such as: You cannot eat or drink without vomiting. Your vomiting gets worse or does not go away. Your vomit has blood or green stuff in it. Symptoms that get worse with treatment. A fever. A very bad headache. Problems with peeing or pooping (having a bowel movement), such as: Watery poop that gets worse or does not go away. Blood in your poop (stool). This may cause poop to look black and tarry. Not peeing in 6-8 hours. Peeing only a small amount of very dark pee in 6-8 hours. Trouble breathing. These symptoms may be an emergency. Do not wait to see if the symptoms will go away. Get medical help right away. Call your local emergency services (911 in the [place] Do not drive yourself to the hospital. Summary Dehydration is a condition in which there is not enough water or other fluids  in the body. This happens when a person loses more fluids than he or she takes in. Treatment for this condition depends on how bad it is. Treatment should be started right away. Do not wait until your condition gets very bad. Drink enough clear fluid to keep your pee pale yellow. If you were told to drink an oral rehydration solution (ORS), finish the ORS first. Then, start slowly drinking other clear fluids. Take over-the-counter and prescription medicines only as told by your doctor. Get help right away if you have any symptoms of very bad dehydration. This information is not intended to replace advice given to you by your health care provider. Make sure you discuss any questions you have with your healthcare provider. Document Revised: 09/20/2018 Document Reviewed: 09/20/2018 Elsevier Patient Education  [place]. Zoledronic Acid Injection (Hypercalcemia, Oncology) What is this medicine? ZOLEDRONIC ACID (ZOE le dron ik AS id) slows calcium loss from bones. It high calcium levels in the blood from some kinds of cancer. It may be used in other people at risk for bone loss. This  medicine may be used for other purposes; ask your health care provider or pharmacist if you have questions. COMMON BRAND NAME(S): Zometa What should I tell my health care provider before I take this medicine? They need to know if you have any of these conditions: cancer dehydration dental disease kidney disease liver disease low levels of calcium in the blood lung or breathing disease (asthma) receiving steroids like dexamethasone or prednisone an unusual or allergic reaction to zoledronic acid, other medicines, foods, dyes, or preservatives pregnant or trying to get pregnant breast-feeding How should I use this medicine? This drug is injected into a vein. It is given by a health care provider in a hospital or clinic setting. Talk to your health care provider about the use of this drug in children.  Special care may be needed. Overdosage: If you think you have taken too much of this medicine contact a poison control center or emergency room at once. NOTE: This medicine is only for you. Do not share this medicine with others. What if I miss a dose? Keep appointments for follow-up doses. It is important not to miss your dose. Call your health care provider if you are unable to keep an appointment. What may interact with this medicine? certain antibiotics given by injection NSAIDs, medicines for pain and inflammation, like ibuprofen or naproxen some diuretics like bumetanide, furosemide teriparatide thalidomide This list may not describe all possible interactions. Give your health care provider a list of all the medicines, herbs, non-prescription drugs, or dietary supplements you use. Also tell them if you smoke, drink alcohol, or use illegal drugs. Some items may interact with your medicine. What should I watch for while using this medicine? Visit your health care provider for regular checks on your progress. It may be some time before you see the benefit from this drug. Some people who take this drug have severe bone, joint, or muscle pain. This drug may also increase your risk for jaw problems or a broken thigh bone. Tell your health care provider right away if you have severe pain in your jaw, bones, joints, or muscles. Tell you health care provider if you have any pain that does not go away or that gets worse. Tell your dentist and dental surgeon that you are taking this drug. You should not have major dental surgery while on this drug. See your dentist to have a dental exam and fix any dental problems before starting this drug. Take good care of your teeth while on this drug. Make sure you see your dentist for regular follow-up appointments. You should make sure you get enough calcium and vitamin D while you are taking this drug. Discuss the foods you eat and the vitamins you take with your  health care provider. Check with your health care provider if you have severe diarrhea, nausea, and vomiting, or if you sweat a lot. The loss of too much body fluid may make it dangerous for you to take this drug. You may need blood work done while you are taking this drug. Do not become pregnant while taking this drug. Women should inform their health care provider if they wish to become pregnant or think they might be pregnant. There is potential for serious harm to an unborn child. Talk to your health care provider for more information. What side effects may I notice from receiving this medicine? Side effects that you should report to your doctor or health care provider as soon as possible: allergic reactions (skin rash,  itching or hives; swelling of the face, lips, or tongue) bone pain infection (fever, chills, cough, sore throat, pain or trouble passing urine) jaw pain, especially after dental work joint pain kidney injury (trouble passing urine or change in the amount of urine) low blood pressure (dizziness; feeling faint or lightheaded, falls; unusually weak or tired) low calcium levels (fast heartbeat; muscle cramps or pain; pain, tingling, or numbness in the hands or feet; seizures) low magnesium levels (fast, irregular heartbeat; muscle cramp or pain; muscle weakness; tremors; seizures) low red blood cell counts (trouble breathing; feeling faint; lightheaded, falls; unusually weak or tired) muscle pain redness, blistering, peeling, or loosening of the skin, including inside the mouth severe diarrhea swelling of the ankles, feet, hands trouble breathing Side effects that usually do not require medical attention (report to your doctor or health care provider if they continue or are bothersome): anxious constipation coughing depressed mood eye irritation, itching, or pain fever general ill feeling or flu-like symptoms nausea pain, redness, or irritation at site where  injected trouble sleeping This list may not describe all possible side effects. Call your doctor for medical advice about side effects. You may report side effects to FDA at 1-800-FDA-1088. Where should I keep my medicine? This drug is given in a hospital or clinic. It will not be stored at home. NOTE: This sheet is a summary. It may not cover all possible information. If you have questions about this medicine, talk to your doctor, pharmacist, or health care provider.  2021 Elsevier/Gold Standard (2018-11-22 09:13:00)

## 2020-07-31 ENCOUNTER — Other Ambulatory Visit: Payer: Self-pay | Admitting: *Deleted

## 2020-07-31 ENCOUNTER — Encounter: Payer: Self-pay | Admitting: Internal Medicine

## 2020-07-31 ENCOUNTER — Inpatient Hospital Stay: Payer: Medicare Other

## 2020-07-31 VITALS — BP 91/53 | HR 90 | Temp 96.8°F | Resp 17

## 2020-07-31 DIAGNOSIS — R590 Localized enlarged lymph nodes: Secondary | ICD-10-CM

## 2020-07-31 DIAGNOSIS — E860 Dehydration: Secondary | ICD-10-CM

## 2020-07-31 LAB — BASIC METABOLIC PANEL
Anion gap: 11 (ref 5–15)
BUN: 33 mg/dL — ABNORMAL HIGH (ref 8–23)
CO2: 29 mmol/L (ref 22–32)
Calcium: 11.8 mg/dL — ABNORMAL HIGH (ref 8.9–10.3)
Chloride: 90 mmol/L — ABNORMAL LOW (ref 98–111)
Creatinine, Ser: 1.08 mg/dL (ref 0.61–1.24)
GFR, Estimated: >60 mL/min (ref >60)
Glucose, Bld: 93 mg/dL (ref 70–99)
Potassium: 4.3 mmol/L (ref 3.5–5.1)
Sodium: 130 mmol/L — ABNORMAL LOW (ref 135–145)

## 2020-07-31 MED ORDER — SODIUM CHLORIDE 0.9 % IV SOLN
Freq: Once | INTRAVENOUS | Status: AC
  Filled 2020-07-31: qty 250

## 2020-07-31 NOTE — Patient Instructions (Signed)
Elevate feet at all times. Pt was givem Knee high TEDS to take home. Instructed to wear stockings while up. Remove them at bedtime, rinse them out nightly.

## 2020-07-31 NOTE — Progress Notes (Signed)
Returns to clinic for lab and IVF's. Pt's pedal edema has increased over night 3 + edema of ankles. Pt placed in recliner with feet elevated. Has a NP congested cough which he has had. Sister sates he has had swelling in his feet but it is worse today. Requested NP Beckey Rutter to evaluate pt to advise how much fluid to give pt today. Dr B ordered 2 liters over 2 hours. BP after 1st liter 104/77. Pt has eaten 2 bags of chips, cookies and sprite. Per NP OK to run 2nd liter at 500 ml /hr. 1129. Pt states you told me 2 hours of IVFs, I am ready to go home now. OK to increase rate of 2nd liter to 999 ml/hr. Patient completed IVF's. Discharge to home per Dr B.

## 2020-08-03 ENCOUNTER — Other Ambulatory Visit: Payer: Self-pay

## 2020-08-03 ENCOUNTER — Inpatient Hospital Stay (HOSPITAL_BASED_OUTPATIENT_CLINIC_OR_DEPARTMENT_OTHER): Payer: Medicare Other | Admitting: Internal Medicine

## 2020-08-03 ENCOUNTER — Inpatient Hospital Stay
Admission: EM | Admit: 2020-08-03 | Discharge: 2020-08-07 | DRG: 641 | Disposition: A | Discharge status: Hospice | Payer: Medicare Other | Attending: Internal Medicine | Admitting: Internal Medicine

## 2020-08-03 ENCOUNTER — Inpatient Hospital Stay (HOSPITAL_BASED_OUTPATIENT_CLINIC_OR_DEPARTMENT_OTHER): Payer: Medicare Other | Admitting: Hospice and Palliative Medicine

## 2020-08-03 ENCOUNTER — Encounter: Payer: Self-pay | Admitting: Internal Medicine

## 2020-08-03 ENCOUNTER — Inpatient Hospital Stay: Payer: Medicare Other

## 2020-08-03 VITALS — BP 97/61 | HR 102 | Temp 97.0°F | Resp 16 | Wt 138.4 lb

## 2020-08-03 DIAGNOSIS — Z79899 Other long term (current) drug therapy: Secondary | ICD-10-CM | POA: Diagnosis not present

## 2020-08-03 DIAGNOSIS — R918 Other nonspecific abnormal finding of lung field: Secondary | ICD-10-CM | POA: Diagnosis present

## 2020-08-03 DIAGNOSIS — C8594 Non-Hodgkin lymphoma, unspecified, lymph nodes of axilla and upper limb: Secondary | ICD-10-CM

## 2020-08-03 DIAGNOSIS — J849 Interstitial pulmonary disease, unspecified: Secondary | ICD-10-CM | POA: Diagnosis present

## 2020-08-03 DIAGNOSIS — E860 Dehydration: Secondary | ICD-10-CM | POA: Diagnosis present

## 2020-08-03 DIAGNOSIS — Z807 Family history of other malignant neoplasms of lymphoid, hematopoietic and related tissues: Secondary | ICD-10-CM

## 2020-08-03 DIAGNOSIS — Z66 Do not resuscitate: Secondary | ICD-10-CM | POA: Diagnosis present

## 2020-08-03 DIAGNOSIS — Z7401 Bed confinement status: Secondary | ICD-10-CM

## 2020-08-03 DIAGNOSIS — C8590 Non-Hodgkin lymphoma, unspecified, unspecified site: Secondary | ICD-10-CM

## 2020-08-03 DIAGNOSIS — D61818 Other pancytopenia: Secondary | ICD-10-CM | POA: Diagnosis present

## 2020-08-03 DIAGNOSIS — F17210 Nicotine dependence, cigarettes, uncomplicated: Secondary | ICD-10-CM | POA: Diagnosis present

## 2020-08-03 DIAGNOSIS — Z20822 Contact with and (suspected) exposure to covid-19: Secondary | ICD-10-CM | POA: Diagnosis present

## 2020-08-03 DIAGNOSIS — Z515 Encounter for palliative care: Secondary | ICD-10-CM | POA: Diagnosis not present

## 2020-08-03 DIAGNOSIS — Z825 Family history of asthma and other chronic lower respiratory diseases: Secondary | ICD-10-CM

## 2020-08-03 DIAGNOSIS — J439 Emphysema, unspecified: Secondary | ICD-10-CM | POA: Diagnosis present

## 2020-08-03 DIAGNOSIS — I9589 Other hypotension: Secondary | ICD-10-CM | POA: Diagnosis present

## 2020-08-03 DIAGNOSIS — E861 Hypovolemia: Secondary | ICD-10-CM | POA: Diagnosis present

## 2020-08-03 DIAGNOSIS — Z9221 Personal history of antineoplastic chemotherapy: Secondary | ICD-10-CM | POA: Diagnosis not present

## 2020-08-03 HISTORY — DX: Non-Hodgkin lymphoma, unspecified, unspecified site: C85.90

## 2020-08-03 LAB — CBC WITH DIFFERENTIAL/PLATELET
Abs Immature Granulocytes: 0.11 10*3/uL — ABNORMAL HIGH (ref 0.00–0.07)
Basophils Absolute: 0 10*3/uL (ref 0.0–0.1)
Basophils Relative: 1 %
Eosinophils Absolute: 0 10*3/uL (ref 0.0–0.5)
Eosinophils Relative: 0 %
HCT: 24.1 % — ABNORMAL LOW (ref 39.0–52.0)
Hemoglobin: 8.2 g/dL — ABNORMAL LOW (ref 13.0–17.0)
Immature Granulocytes: 5 %
Lymphocytes Relative: 15 %
Lymphs Abs: 0.3 10*3/uL — ABNORMAL LOW (ref 0.7–4.0)
MCH: 30.7 pg (ref 26.0–34.0)
MCHC: 34 g/dL (ref 30.0–36.0)
MCV: 90.3 fL (ref 80.0–100.0)
Monocytes Absolute: 0.2 10*3/uL (ref 0.1–1.0)
Monocytes Relative: 8 %
Neutro Abs: 1.6 10*3/uL — ABNORMAL LOW (ref 1.7–7.7)
Neutrophils Relative %: 71 %
Platelets: 140 10*3/uL — ABNORMAL LOW (ref 150–400)
RBC: 2.67 MIL/uL — ABNORMAL LOW (ref 4.22–5.81)
RDW: 20.6 % — ABNORMAL HIGH (ref 11.5–15.5)
WBC: 2.3 10*3/uL — ABNORMAL LOW (ref 4.0–10.5)
nRBC: 0 % (ref 0.0–0.2)

## 2020-08-03 LAB — BASIC METABOLIC PANEL
Anion gap: 8 (ref 5–15)
BUN: 37 mg/dL — ABNORMAL HIGH (ref 8–23)
CO2: 30 mmol/L (ref 22–32)
Calcium: 12.3 mg/dL — ABNORMAL HIGH (ref 8.9–10.3)
Chloride: 90 mmol/L — ABNORMAL LOW (ref 98–111)
Creatinine, Ser: 1.46 mg/dL — ABNORMAL HIGH (ref 0.61–1.24)
GFR, Estimated: 52 mL/min — ABNORMAL LOW (ref >60)
Glucose, Bld: 103 mg/dL — ABNORMAL HIGH (ref 70–99)
Potassium: 4.4 mmol/L (ref 3.5–5.1)
Sodium: 128 mmol/L — ABNORMAL LOW (ref 135–145)

## 2020-08-03 LAB — COMPREHENSIVE METABOLIC PANEL
ALT: 37 U/L (ref 0–44)
AST: 77 U/L — ABNORMAL HIGH (ref 15–41)
Albumin: 2.9 g/dL — ABNORMAL LOW (ref 3.5–5.0)
Alkaline Phosphatase: 113 U/L (ref 38–126)
Anion gap: 4 — ABNORMAL LOW (ref 5–15)
BUN: 36 mg/dL — ABNORMAL HIGH (ref 8–23)
CO2: 32 mmol/L (ref 22–32)
Calcium: 11.9 mg/dL — ABNORMAL HIGH (ref 8.9–10.3)
Chloride: 94 mmol/L — ABNORMAL LOW (ref 98–111)
Creatinine, Ser: 1.25 mg/dL — ABNORMAL HIGH (ref 0.61–1.24)
GFR, Estimated: >60 mL/min (ref >60)
Glucose, Bld: 100 mg/dL — ABNORMAL HIGH (ref 70–99)
Potassium: 4.2 mmol/L (ref 3.5–5.1)
Sodium: 130 mmol/L — ABNORMAL LOW (ref 135–145)
Total Bilirubin: 0.8 mg/dL (ref 0.3–1.2)
Total Protein: 4.9 g/dL — ABNORMAL LOW (ref 6.5–8.1)

## 2020-08-03 LAB — PHOSPHORUS: Phosphorus: 3.7 mg/dL (ref 2.5–4.6)

## 2020-08-03 LAB — MAGNESIUM: Magnesium: 1.6 mg/dL — ABNORMAL LOW (ref 1.7–2.4)

## 2020-08-03 LAB — SARS CORONAVIRUS 2 (TAT 6-24 HRS): SARS Coronavirus 2: NEGATIVE

## 2020-08-03 MED ORDER — SODIUM CHLORIDE 0.9 % IV SOLN: Freq: Once | INTRAVENOUS | Status: AC

## 2020-08-03 MED ORDER — ALBUTEROL SULFATE (2.5 MG/3ML) 0.083% IN NEBU: 3.0000 mL | INHALATION_SOLUTION | Freq: Four times a day (QID) | RESPIRATORY_TRACT | Status: DC | PRN

## 2020-08-03 MED ORDER — PANTOPRAZOLE SODIUM 40 MG PO TBEC
40.0000 mg | DELAYED_RELEASE_TABLET | Freq: Every day | ORAL | Status: DC
  Administered 2020-08-04 – 2020-08-07 (×4): 40 mg via ORAL
  Filled 2020-08-03 (×4): qty 1

## 2020-08-03 MED ORDER — NICOTINE 21 MG/24HR TD PT24
21.0000 mg | MEDICATED_PATCH | Freq: Every day | TRANSDERMAL | Status: DC
  Filled 2020-08-03 (×3): qty 1

## 2020-08-03 MED ORDER — ACETAMINOPHEN 325 MG PO TABS: 650.0000 mg | ORAL_TABLET | Freq: Four times a day (QID) | ORAL | Status: DC | PRN

## 2020-08-03 MED ORDER — MAGNESIUM SULFATE 2 GM/50ML IV SOLN
2.0000 g | Freq: Once | INTRAVENOUS | Status: AC
  Administered 2020-08-03: 2 g via INTRAVENOUS
  Filled 2020-08-03: qty 50

## 2020-08-03 MED ORDER — ACETAMINOPHEN 650 MG RE SUPP: 650.0000 mg | Freq: Four times a day (QID) | RECTAL | Status: DC | PRN

## 2020-08-03 MED ORDER — SODIUM CHLORIDE 0.9 % IV SOLN
Freq: Once | INTRAVENOUS | Status: AC
  Filled 2020-08-03: qty 250

## 2020-08-03 NOTE — ED Triage Notes (Signed)
Pt comes with c/o abnormal labs. Pt sent from [facility] for hypercalcemia, hyponatremia and hypotension.  Pt denies any pain or other complaints.

## 2020-08-03 NOTE — ED Provider Notes (Signed)
[facility] Emergency Department Provider Note   ____________________________________________   Event Date/Time   First MD Initiated Contact with Patient 08/03/20 1638     (approximate)  I have reviewed the triage vital signs and the nursing notes.   HISTORY  Chief Complaint Abnormal Lab    HPI Patrick Shepard is a 70 y.o. male with past medical history of lymphoma and COPD who presents to the ED for abnormal labs.  Patient was recently diagnosed with lymphoma and has been dealing with hypercalcemia as well as pancytopenia.  He required admission earlier this month due to hypercalcemia and has continue to follow with oncology for management of hypercalcemia as well as hypokalemia and hypomagnesemia.  Patient has recently remained hypercalcemic despite IV fluids, was seen at his oncologist office today and advised to go to the ED for admission for management of this.  Patient was also seen by palliative care today and signed a DNR.  He currently states he feels weak but he denies any fevers, cough, chest pain, shortness of breath, vomiting, or diarrhea.        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 Did not have knee replacement   LAPAROTOMY     done at age 47 for intestinal issues    Prior to Admission medications   Medication Sig Start Date End Date Taking? Authorizing Provider  albuterol (VENTOLIN HFA) 108 (90 Base) MCG/ACT inhaler Inhale 2 puffs into the lungs every 6 (six) hours as needed for wheezing or  shortness of breath. 07/30/20   Cammie Sickle, MD  calcium-vitamin D (OSCAL WITH D) 500-200 MG-UNIT tablet Take 1 tablet by mouth.    [provider]  feeding supplement (ENSURE ENLIVE / ENSURE PLUS) LIQD Take 237 mLs by mouth 2 (two) times daily between meals. 07/25/20   Regalado, Belkys A, MD  magnesium oxide (MAG-OX) 400 (240 Mg) MG tablet Take 1 tablet (400 mg total) by mouth 2 (two) times daily. Patient not taking: No sig reported 07/25/20   Regalado, Belkys A, MD  magnesium oxide (MAG-OX) 400 MG tablet Take 1 tablet by mouth 2 (two) times daily. 07/25/20   [provider]  nystatin (MYCOSTATIN) 100000 UNIT/ML suspension Take 5 mLs (500,000 Units total) by mouth 4 (four) times daily. Patient not taking: No sig reported 07/25/20   Regalado, Belkys A, MD  pantoprazole (PROTONIX) 40 MG tablet Take 1 tablet (40 mg total) by mouth daily. 07/25/20 07/25/21  Regalado, Belkys A, MD  potassium chloride SA (KLOR-CON) 20 MEQ tablet Take 1 tablet (20 mEq total) by mouth daily. 07/25/20   Regalado, Belkys A, MD  traZODone (DESYREL) 50 MG tablet Take 0.5 tablets (25 mg total) by mouth at bedtime as needed for sleep. 07/25/20   Regalado, Cassie Freer, MD    Allergies Patient has no known allergies.  Family History  Problem Relation Age of Onset   COPD Mother    Alcoholism Father    Cirrhosis Father  Social History Social History   Tobacco Use   Smoking status: Every Day    Packs/day: 1.00    Pack years: 0.00    Types: Cigarettes   Smokeless tobacco: Former  Scientific laboratory technician Use: Never used    Review of Systems  Constitutional: No fever/chills.  Positive for generalized weakness. Eyes: No visual changes. ENT: No sore throat. Cardiovascular: Denies chest pain. Respiratory: Denies shortness of breath. Gastrointestinal: No abdominal pain.  No nausea, no vomiting.  No diarrhea.  No constipation. Genitourinary: Negative for dysuria. Musculoskeletal: Negative for back  pain. Skin: Negative for rash. Neurological: Negative for headaches, focal weakness or numbness.  ____________________________________________   PHYSICAL EXAM:  VITAL SIGNS: ED Triage Vitals  Enc Vitals Group     BP 08/03/20 1625 (!) 99/58     Pulse Rate 08/03/20 1625 (!) 103     Resp 08/03/20 1625 17     Temp 08/03/20 1625 98.5 F (36.9 C)     Temp src --      SpO2 08/03/20 1625 100 %     Weight --      Height --      Head Circumference --      Peak Flow --      Pain Score 08/03/20 1626 0     Pain Loc --      Pain Edu? --      Excl. in [place]? --     Constitutional: Alert and oriented. Eyes: Conjunctivae are normal. Head: Atraumatic. Nose: No congestion/rhinnorhea. Mouth/Throat: Mucous membranes are moist. Neck: Normal ROM Cardiovascular: Normal rate, regular rhythm. Grossly normal heart sounds.  2+ radial pulses bilaterally. Respiratory: Normal respiratory effort.  No retractions. Lungs CTAB. Gastrointestinal: Soft and nontender. No distention. Genitourinary: deferred Musculoskeletal: No lower extremity tenderness nor edema. Neurologic:  Normal speech and language. No gross focal neurologic deficits are appreciated. Skin:  Skin is warm, dry and intact. No rash noted. Psychiatric: Mood and affect are normal. Speech and behavior are normal.  ____________________________________________   LABS (all labs ordered are listed, but only abnormal results are displayed)  Labs Reviewed  CBC WITH DIFFERENTIAL/PLATELET - Abnormal; Notable for the following components:      Result Value   WBC 2.3 (*)    RBC 2.67 (*)    Hemoglobin 8.2 (*)    HCT 24.1 (*)    RDW 20.6 (*)    Platelets 140 (*)    Neutro Abs 1.6 (*)    Lymphs Abs 0.3 (*)    Abs Immature Granulocytes 0.11 (*)    All other components within normal limits  COMPREHENSIVE METABOLIC PANEL - Abnormal; Notable for the following components:   Sodium 130 (*)    Chloride 94 (*)    Glucose, Bld 100 (*)    BUN 36 (*)     Creatinine, Ser 1.25 (*)    Calcium 11.9 (*)    Total Protein 4.9 (*)    Albumin 2.9 (*)    AST 77 (*)    Anion gap 4 (*)    All other components within normal limits  MAGNESIUM - Abnormal; Notable for the following components:   Magnesium 1.6 (*)    All other components within normal limits  SARS CORONAVIRUS 2 (TAT 6-24 HRS)  PHOSPHORUS  CALCIUM, IONIZED   ____________________________________________  EKG  ED ECG REPORT I, Blake Divine, the attending physician, personally viewed and interpreted this ECG.   Date: 08/03/2020  EKG Time: 16:28  Rate: 104  Rhythm: sinus tachycardia  Axis: Normal  Intervals:none  ST&T Change: None  ED ECG REPORT I, Blake Divine, the attending physician, personally viewed and interpreted this ECG.   Date: 08/03/2020  EKG Time: 17:22  Rate: 92  Rhythm: normal sinus rhythm, frequent PVC's noted  Axis: Normal  Intervals:none  ST&T Change: None    PROCEDURES  Procedure(s) performed (including Critical Care):  Procedures   ____________________________________________   INITIAL IMPRESSION / ASSESSMENT AND PLAN / ED COURSE      70 year old male with past medical history of lymphoma and COPD who presents to the ED with generalized weakness and abnormal labs noted at his oncologist earlier today.  Repeat labs here in the ED continued to demonstrate hypercalcemia that has been resistant to outpatient treatment.  Potassium within normal limits today, magnesium and phosphorus levels are pending.  Patient does have borderline low blood pressure which is likely due to hypokalemia.  We will treat his hypercalcemia and hypovolemia with normal saline infusion, low suspicion for infectious process at this time.  Plan to discuss with hospitalist for admission.  Additional labs remarkable for hypomagnesemia, which we will replete.  Case discussed with hospitalist for admission.      ____________________________________________   FINAL  CLINICAL IMPRESSION(S) / ED DIAGNOSES  Final diagnoses:  Hypercalcemia  Hypomagnesemia  Lymphoma, unspecified body region, unspecified lymphoma type [facility])     ED Discharge Orders     None        Note:  This document was prepared using Dragon voice recognition software and may include unintentional dictation errors.    Blake Divine, MD 08/03/20 1759

## 2020-08-03 NOTE — Progress Notes (Signed)
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     HISTORY OF PRESENTING ILLNESS:  Patrick Shepard 70 y.o.  male was recently admitted hospital for pancytopenia/hypercalcemia.  Patient underwent right axillary lymph node biopsy.  Also underwent treatment with calcitonin and Zometa x2 [last zometa on 6/11.] patient is here for follow-up with regards to hypercalcemia/IV fluids.   Patient continues to complain of generalized weakness.  Denies any nausea vomiting.  Shortness of breath on exertion.  Positive for weight loss.  Positive for fatigue.  No fevers or chills.  Poor p.o. intake  Review of Systems  Constitutional:  Positive for malaise/fatigue and weight loss. Negative for chills, diaphoresis and fever.  HENT:  Negative for nosebleeds and sore throat.   Eyes:  Negative for double vision.  Respiratory:  Positive for shortness of breath. Negative for cough, hemoptysis, sputum production and wheezing.   Cardiovascular:  Negative for chest pain, palpitations, orthopnea and leg swelling.  Gastrointestinal:  Negative for abdominal pain, blood in stool, constipation, diarrhea, heartburn, melena, nausea and vomiting.  Genitourinary:  Negative for dysuria, frequency and urgency.  Musculoskeletal:  Positive for back pain. Negative for joint pain.  Skin: Negative.  Negative for itching and rash.  Neurological:  Positive for dizziness and weakness. Negative for tingling, focal weakness and headaches.  Endo/Heme/Allergies:  Does not bruise/bleed easily.  Psychiatric/Behavioral:  Negative for depression. The patient is not nervous/anxious and does not have insomnia.     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 Lymph node biopsy on 07/21/20. Oncology noted: Lymph node biopsy [right underarm core biopsy]-positive for lymphoma.  Discussed with Dr.Kraynie.  However unfortunately unable to subclassify further at this time.  Awaiting second opinion at tertiary center.   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 Did not have knee replacement   LAPAROTOMY     done at age 79 for intestinal issues    SOCIAL HISTORY: Social History   Socioeconomic History   Marital status: Single    Spouse name: Not on file   Number of children: Not on file   Years of education: Not on file   Highest education level: Not on file  Occupational History   Not on file  Tobacco Use   Smoking status: Every Day    Packs/day: 1.00    Pack years: 0.00    Types: Cigarettes   Smokeless tobacco: Former  Scientific laboratory technician Use: Never used  Substance and Sexual Activity   Alcohol use: Not on file   Drug use: Not on file   Sexual activity: Not on file  Other Topics Concern   Not on file  Social History Narrative   Not on file   Social Determinants of Health   Financial Resource Strain: Not on file  Food Insecurity: Not on file  Transportation Needs: Not on file  Physical  Activity: Not on file  Stress: Not on file  Social Connections: Not on file  Intimate Partner Violence: Not on file    FAMILY HISTORY: Family History  Problem Relation Age of Onset   COPD Mother    Alcoholism Father    Cirrhosis Father     ALLERGIES:  has No Known Allergies.  MEDICATIONS:  No current facility-administered medications for this visit.   No current outpatient medications on file.   Facility-Administered Medications  Ordered in Other Visits  Medication Dose Route Frequency Provider Last Rate Last Admin   acetaminophen (TYLENOL) tablet 650 mg  650 mg Oral Q6H PRN Para Skeans, MD       Or   acetaminophen (TYLENOL) suppository 650 mg  650 mg Rectal Q6H PRN Para Skeans, MD       albuterol (PROVENTIL) (2.5 MG/3ML) 0.083% nebulizer solution 3 mL  3 mL Nebulization Q6H PRN Para Skeans, MD       nicotine (NICODERM CQ - dosed in mg/24 hours) patch 21 mg  21 mg Transdermal Daily Para Skeans, MD       pantoprazole (PROTONIX) EC tablet 40 mg  40 mg Oral Daily Para Skeans, MD          .  PHYSICAL EXAMINATION: ECOG PERFORMANCE STATUS: 2 - Symptomatic, <50% confined to bed  There were no vitals filed for this visit.  There were no vitals filed for this visit.   Physical Exam Constitutional:      Comments: Frail-appearing Caucasian male patient.  In a wheelchair.  Accompanied by family/sister.  HENT:     Head: Normocephalic and atraumatic.     Mouth/Throat:     Pharynx: No oropharyngeal exudate.  Eyes:     Pupils: Pupils are equal, round, and reactive to light.  Cardiovascular:     Rate and Rhythm: Normal rate and regular rhythm.  Pulmonary:     Effort: No respiratory distress.     Breath sounds: No wheezing.     Comments: Decreased breath sounds bilaterally at bases.  No wheeze or crackles Abdominal:     General: Bowel sounds are normal. There is no distension.     Palpations: Abdomen is soft. There is no mass.     Tenderness: no abdominal tenderness There is no guarding or rebound.  Musculoskeletal:        General: No tenderness. Normal range of motion.     Cervical back: Normal range of motion and neck supple.  Skin:    General: Skin is warm.  Neurological:     Mental Status: He is alert and oriented to person, place, and time.  Psychiatric:        Mood and Affect: Affect normal.     LABORATORY DATA:  I have reviewed the data as listed Lab Results  Component Value Date   WBC  2.3 (L) 08/03/2020   HGB 8.2 (L) 08/03/2020   HCT 24.1 (L) 08/03/2020   MCV 90.3 08/03/2020   PLT 140 (L) 08/03/2020   Recent Labs    07/20/20 1118 07/21/20 0626 07/25/20 0358 07/30/20 1523 07/31/20 0929 08/03/20 1143 08/03/20 1628  NA  --    < > 133* 128* 130* 128* 130*  K  --    < > 3.8 4.9 4.3 4.4 4.2  CL  --    < > 101 87* 90* 90* 94*  CO2  --    < > 24 31 29 30  32  GLUCOSE  --    < >  84 84 93 103* 100*  BUN  --    < > 23 37* 33* 37* 36*  CREATININE  --    < > 0.93 1.24 1.08 1.46* 1.25*  CALCIUM  --    < > 8.8* 12.7* 11.8* 12.3* 11.9*  GFRNONAA  --    < > >60 >60 >60 52* >60  PROT 5.0*   < > 4.1* 5.3*  --   --  4.9*  ALBUMIN 3.0*   < > 2.4* 3.0*  --   --  2.9*  AST 70*   < > 68* 70*  --   --  77*  ALT 22   < > 20 38  --   --  37  ALKPHOS 65   < > 65 101  --   --  113  BILITOT 1.6*   < > 1.1 0.9  --   --  0.8  BILIDIR 0.5*  --   --   --   --   --   --   IBILI 1.1*  --   --   --   --   --   --    < > = values in this interval not displayed.    RADIOGRAPHIC STUDIES: I have personally reviewed the radiological images as listed and agreed with the findings in the report. CT ABDOMEN PELVIS WO CONTRAST  Result Date: 07/20/2020 CLINICAL DATA:  70 year old male with history of lymphadenopathy. EXAM: CT ABDOMEN AND PELVIS WITHOUT CONTRAST TECHNIQUE: Multidetector CT imaging of the abdomen and pelvis was performed following the standard protocol without IV contrast. COMPARISON:  No priors. FINDINGS: Lower chest: 1.6 x 1.2 cm left lower lobe pulmonary nodule (axial image 3 of series 4). Atherosclerotic calcifications in the descending thoracic aorta as well as the left main, left anterior descending and right coronary arteries. Mild calcifications of the aortic valve. Hepatobiliary: Low-attenuation lesions are noted in the liver, incompletely characterized on today's non-contrast CT examination, but statistically likely to represent cysts, largest of which is in segment 4A (axial image  19 of series 2) measuring 4.0 x 2.6 cm. Amorphous intermediate attenuation material lying dependently in the gallbladder, likely to represent biliary sludge. Gallbladder is nearly decompressed. Pancreas: No definite pancreatic mass or peripancreatic fluid collections or inflammatory changes are noted on today's noncontrast CT examination. Spleen: Spleen is enlarged measuring 15.3 x 6.4 x 13.8 cm (estimated splenic volume of 665 mL) . Adrenals/Urinary Tract: 3 mm nonobstructive calculus in the interpolar collecting system of the left kidney. No additional calculi are noted within the right renal collecting system, along the course of either ureter, or within the lumen of the urinary bladder. No hydroureteronephrosis. Unenhanced appearance of the kidneys is otherwise unremarkable. Bilateral adrenal glands are normal in appearance. Urinary bladder is normal in appearance. Stomach/Bowel: Unenhanced appearance of the stomach is normal. No pathologic dilatation of small bowel or colon. The appendix is not confidently identified and may be surgically absent. Regardless, there are no inflammatory changes noted adjacent to the cecum to suggest the presence of an acute appendicitis at this time. Vascular/Lymphatic: Aortic atherosclerosis. Multiple enlarged retroperitoneal lymph nodes, largest of which is in the left para-aortic nodal station adjacent to the left renal hilum (axial image 30 of series 3) measuring 1.8 x 1.5 cm. Reproductive: Prostate gland and seminal vesicles are unremarkable in appearance. Other: No significant volume of ascites.  No pneumoperitoneum. Musculoskeletal: There are no aggressive appearing lytic or blastic lesions noted in the visualized portions of  the skeleton. Chronic appearing compression fracture of T12 with 25% loss of anterior vertebral body height. IMPRESSION: 1. Extensive retroperitoneal lymphadenopathy. There is also splenomegaly. Clinical correlation for signs and symptoms of  lymphoproliferative disorder is recommended. 2. 1.6 x 1.2 cm left lower lobe pulmonary nodule concerning for neoplasm. Consider one of the following in 3 months for both low-risk and high-risk individuals: (a) repeat chest CT or (b) follow-up PET-CT. This recommendation follows the consensus statement: Guidelines for Management of Incidental Pulmonary Nodules Detected on CT Images: From the Fleischner Society 2017; Radiology 2017; 284:228-243. 3. Probable biliary sludge lying dependently in the gallbladder. 4. Aortic atherosclerosis. 5. Additional incidental findings, as above. Electronically Signed   By: Vinnie Langton M.D.   On: 07/20/2020 15:23   CT Chest Wo Contrast  Result Date: 07/19/2020 CLINICAL DATA:  Dyspnea. Interstitial lung disease suspected. Altered mental status over the last couple of days. EXAM: CT CHEST WITHOUT CONTRAST TECHNIQUE: Multidetector CT imaging of the chest was performed following the standard protocol without IV contrast. COMPARISON:  Current and prior chest radiographs. FINDINGS: Cardiovascular: Heart normal in size. Coronary artery calcifications. Aortic atherosclerosis. Mediastinum/Nodes: Enlarged right axillary lymph nodes, largest measuring 1.3 cm in short axis. There are shoddy subcentimeter neck base lymph nodes. There are several prominent to mildly enlarged mediastinal lymph nodes. Azygos level right paratracheal node measures 1.3 cm in short axis. Subcarinal node measures 1.8 cm in short axis. Probable mildly enlarged inferior right hilar lymph node, 1.3 cm in short axis. Trachea and esophagus are unremarkable. Lungs/Pleura: Lungs demonstrate interstitial thickening peripheral cystic spaces, with a predominance in the mid to upper lungs. There is an ill-defined 1.1 cm nodular opacity in the left lower lobe, image 91, series 3. No convincing pneumonia. No pulmonary edema. No pleural effusion or pneumothorax. Upper Abdomen: Enlarged spleen, 15 cm in greatest transverse  dimension. Prominent to mildly enlarged lymph nodes in the upper abdomen, in the visualized retroperitoneum and along the Peri celiac chain. Low-density liver lesions consistent with cysts. Musculoskeletal: Skeletal structures are demineralized. There multiple vertebral compression fractures, T4, T8, T9 as well as L1, which appear chronic. No bone lesions. IMPRESSION: 1. Interstitial lung disease reflected by peripheral cystic change, interstitial thickening, areas of honeycombing and architectural distortion, with a mid to upper lung predominance. 2. 1.1 cm ill-defined nodular opacity in the left lower lobe. This could be inflammatory. Neoplastic disease is possible. Consider one of the following in 3 months for both low-risk and high-risk individuals: (a) repeat chest CT, (b) follow-up PET-CT, or (c) tissue sampling. This recommendation follows the consensus statement: Guidelines for Management of Incidental Pulmonary Nodules Detected on CT Images: From the Fleischner Society 2017; Radiology 2017; 284:228-243. No other findings in the lungs to suggest active inflammation or infection. No pulmonary edema. 3. Enlarged lymph nodes, most apparent in the right axilla, milder adenopathy along the mediastinum and right hilum, with adenopathy also noted in the visualized upper abdomen. There is also splenomegaly. The combination of findings is concerning for a lymphoproliferative disorder including lymphoma. 4. Coronary artery calcifications and aortic atherosclerosis. Aortic Atherosclerosis (ICD10-I70.0). Electronically Signed   By: Lajean Manes M.D.   On: 07/19/2020 21:07   DG Chest Portable 1 View  Result Date: 07/19/2020 CLINICAL DATA:  Weakness. EXAM: PORTABLE CHEST 1 VIEW COMPARISON:  02/10/2011. FINDINGS: Cardiac silhouette is normal in size. Normal mediastinal and hilar contours. Lungs show bilateral interstitial thickening similar to the prior exam allowing for differences in radiographic technique. No lung  consolidation. No  pleural effusion or pneumothorax. Skeletal structures are grossly intact. IMPRESSION: 1. No acute cardiopulmonary disease. 2. Chronic bilateral interstitial thickening. Electronically Signed   By: Lajean Manes M.D.   On: 07/19/2020 19:56   [place] CORE BIOPSY (LYMPH NODES)  Result Date: 07/21/2020 INDICATION: 70 year old male presenting with weight loss, left lower lobe mass, and lymphadenopathy. EXAM: ULTRASOUND GUIDED core BIOPSY OF right axillary lymph node. MEDICATIONS: None. ANESTHESIA/SEDATION: Fentanyl 25 mcg IV; Versed 0.5 mg IV Moderate Sedation Time:  7 The patient was continuously monitored during the procedure by the interventional radiology nurse under my direct supervision. PROCEDURE: The procedure, risks, benefits, and alternatives were explained to the patient. Questions regarding the procedure were encouraged and answered. The patient understands and consents to the procedure. The right axilla was prepped with chlorhexidine in a sterile fashion, and a sterile drape was applied covering the operative field. A sterile gown and sterile gloves were used for the procedure. Preprocedure ultrasound evaluation demonstrated multiple prominent right axillary lymph nodes. The dominant node was selected for biopsy. The procedure was planned. Local anesthesia was provided with 1% Lidocaine. A small skin nick was made. Under direct ultrasound visualization, a total of 3, 18 gauge core biopsies were obtained. The samples were placed on saline soaked Telfa and sent to Pathology. Postprocedural imaging demonstrated no evidence of surrounding hematoma. Sterile bandage was placed. The patient tolerated procedure well was transferred back to floor in stable condition. COMPLICATIONS: None immediate. FINDINGS: Right axillary lymphadenopathy. IMPRESSION: Technically successful ultrasound-guided right axillary lymph node biopsy. 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  S/p IV fluids; plan repeat bisphosphonate/calcitonin [see below]  #Lymph node biopsy [right underarm core biopsy]-positive for lymphoma.  Discussed with Dr.Kraynie.  However unfortunately unable to subclassify further at this time.  We again will reach out to expedite the results.  #Borderline low blood pressures 53Z to 48O systolic-likely secondary to hypercalcemia/dehydration; as above.  See discussion below.   # Pancytopenia/splenomegaly/generalized lymphadenopathy--again concerning for lymphoma.  Await above work-up.   #Interstitial lung disease based on imaging / lung nodule ~1.1cm LLL/active smoker. Out pt follow up.  #Patient reluctant with admission to the hospital.  I had with had a long discussion with patient/sister regarding immediate prognosis with respect to hypercalcemia-is extremely poor without any acute interventions.  Also discussed long-term prognosis from lymphoma is unclear-given the pending results.  In general lymphomas are treated the goal for cure; however this would involve systemic therapy/frequent visits to the clinic/interventions.  Discussed with Josh.  After multiple back-and-forth conversations patient finally agreed to be admitted to hospital.  Patient sent to ER.  # 40 minutes face-to-face with the patient discussing the above plan of care; more than 50% of time spent on prognosis/ natural history; counseling and coordination.  DISPOSITION: # follow up TBD- Dr.B   All questions were answered. The patient knows to call the clinic with any problems, questions or concerns.    Cammie Sickle, MD 08/04/2020 8:10 AM

## 2020-08-03 NOTE — Progress Notes (Signed)
[facility]  [phone] [phone]   Name: Patrick Shepard Date: 08/03/2020 MRN: [id number]  DOB: [birth year]  Patient Care Team: Mechele Claude, FNP as PCP - General (Family Medicine)    REASON FOR CONSULTATION: Patrick Shepard is a 70 y.o. male with multiple medical problems including recently hospitalized for pancytopenia and hypercalcemia with work-up concerning for lymphoma.  Patient was seen in the clinic today by medical oncology and was found to have hypotension and worsening hypercalcemia.  Palliative care was consulted help address goals.  SOCIAL HISTORY:     reports that he has been smoking. He has been smoking an average of 1.00 packs per day. He has quit using smokeless tobacco.  Patient is unmarried.  He lives in a group home.  He has several siblings who are involved in his care.  ADVANCE DIRECTIVES:  Not on file  CODE STATUS: DNR/DNI (DNR order signed on 08/03/2020)  PAST MEDICAL HISTORY: Past Medical History:  Diagnosis Date   Seizure in childhood [facility])    no seizure for many years   Tobacco abuse     PAST SURGICAL HISTORY:  Past Surgical History:  Procedure Laterality Date   KNEE SURGERY Left    for injury. Did not have knee replacement   LAPAROTOMY     done at age 42 for intestinal issues    HEMATOLOGY/ONCOLOGY HISTORY:  Oncology History   No history exists.    ALLERGIES:  has No Known Allergies.  MEDICATIONS:  Current Outpatient Medications  Medication Sig Dispense Refill   albuterol (VENTOLIN HFA) 108 (90 Base) MCG/ACT inhaler Inhale 2 puffs into the lungs every 6 (six) hours as needed for wheezing or shortness of breath. 1 each 2   calcium-vitamin D (OSCAL WITH D) 500-200 MG-UNIT tablet Take 1 tablet by mouth.     feeding supplement (ENSURE ENLIVE / ENSURE PLUS) LIQD Take 237 mLs by mouth 2 (two) times daily between meals. 237 mL 12   magnesium oxide (MAG-OX) 400 (240  Mg) MG tablet Take 1 tablet (400 mg total) by mouth 2 (two) times daily. (Patient not taking: No sig reported) 60 tablet 1   magnesium oxide (MAG-OX) 400 MG tablet Take 1 tablet by mouth 2 (two) times daily.     nystatin (MYCOSTATIN) 100000 UNIT/ML suspension Take 5 mLs (500,000 Units total) by mouth 4 (four) times daily. (Patient not taking: No sig reported) 60 mL 0   pantoprazole (PROTONIX) 40 MG tablet Take 1 tablet (40 mg total) by mouth daily. 30 tablet 1   potassium chloride SA (KLOR-CON) 20 MEQ tablet Take 1 tablet (20 mEq total) by mouth daily. 20 tablet 0   traZODone (DESYREL) 50 MG tablet Take 0.5 tablets (25 mg total) by mouth at bedtime as needed for sleep. 30 tablet 1   No current facility-administered medications for this visit.    VITAL SIGNS: There were no vitals taken for this visit. There were no vitals filed for this visit.  Estimated body mass index is 21.04 kg/m as calculated from the following:   Height as of 07/19/20: 5\' 8"  (1.727 m).   Weight as of an earlier encounter on 08/03/20: 138 lb 6.4 oz (62.8 kg).  LABS: CBC:    Component Value Date/Time   WBC 2.4 (L) 07/30/2020 1523   HGB 8.2 (L) 07/30/2020 1523   HCT 23.7 (L) 07/30/2020 1523   PLT 114 (L) 07/30/2020 1523   MCV 88.8 07/30/2020 1523  NEUTROABS 1.8 07/30/2020 1523   LYMPHSABS 0.4 (L) 07/30/2020 1523   MONOABS 0.2 07/30/2020 1523   EOSABS 0.0 07/30/2020 1523   BASOSABS 0.0 07/30/2020 1523   Comprehensive Metabolic Panel:    Component Value Date/Time   NA 128 (L) 08/03/2020 1143   K 4.4 08/03/2020 1143   CL 90 (L) 08/03/2020 1143   CO2 30 08/03/2020 1143   BUN 37 (H) 08/03/2020 1143   CREATININE 1.46 (H) 08/03/2020 1143   GLUCOSE 103 (H) 08/03/2020 1143   CALCIUM 12.3 (H) 08/03/2020 1143   CALCIUM 11.1 (H) 07/20/2020 0607   AST 70 (H) 07/30/2020 1523   ALT 38 07/30/2020 1523   ALKPHOS 101 07/30/2020 1523   BILITOT 0.9 07/30/2020 1523   PROT 5.3 (L) 07/30/2020 1523   ALBUMIN 3.0 (L)  07/30/2020 1523    RADIOGRAPHIC STUDIES: CT ABDOMEN PELVIS WO CONTRAST  Result Date: 07/20/2020 CLINICAL DATA:  70 year old male with history of lymphadenopathy. EXAM: CT ABDOMEN AND PELVIS WITHOUT CONTRAST TECHNIQUE: Multidetector CT imaging of the abdomen and pelvis was performed following the standard protocol without IV contrast. COMPARISON:  No priors. FINDINGS: Lower chest: 1.6 x 1.2 cm left lower lobe pulmonary nodule (axial image 3 of series 4). Atherosclerotic calcifications in the descending thoracic aorta as well as the left main, left anterior descending and right coronary arteries. Mild calcifications of the aortic valve. Hepatobiliary: Low-attenuation lesions are noted in the liver, incompletely characterized on today's non-contrast CT examination, but statistically likely to represent cysts, largest of which is in segment 4A (axial image 19 of series 2) measuring 4.0 x 2.6 cm. Amorphous intermediate attenuation material lying dependently in the gallbladder, likely to represent biliary sludge. Gallbladder is nearly decompressed. Pancreas: No definite pancreatic mass or peripancreatic fluid collections or inflammatory changes are noted on today's noncontrast CT examination. Spleen: Spleen is enlarged measuring 15.3 x 6.4 x 13.8 cm (estimated splenic volume of 665 mL) . Adrenals/Urinary Tract: 3 mm nonobstructive calculus in the interpolar collecting system of the left kidney. No additional calculi are noted within the right renal collecting system, along the course of either ureter, or within the lumen of the urinary bladder. No hydroureteronephrosis. Unenhanced appearance of the kidneys is otherwise unremarkable. Bilateral adrenal glands are normal in appearance. Urinary bladder is normal in appearance. Stomach/Bowel: Unenhanced appearance of the stomach is normal. No pathologic dilatation of small bowel or colon. The appendix is not confidently identified and may be surgically absent.  Regardless, there are no inflammatory changes noted adjacent to the cecum to suggest the presence of an acute appendicitis at this time. Vascular/Lymphatic: Aortic atherosclerosis. Multiple enlarged retroperitoneal lymph nodes, largest of which is in the left para-aortic nodal station adjacent to the left renal hilum (axial image 30 of series 3) measuring 1.8 x 1.5 cm. Reproductive: Prostate gland and seminal vesicles are unremarkable in appearance. Other: No significant volume of ascites.  No pneumoperitoneum. Musculoskeletal: There are no aggressive appearing lytic or blastic lesions noted in the visualized portions of the skeleton. Chronic appearing compression fracture of T12 with 25% loss of anterior vertebral body height. IMPRESSION: 1. Extensive retroperitoneal lymphadenopathy. There is also splenomegaly. Clinical correlation for signs and symptoms of lymphoproliferative disorder is recommended. 2. 1.6 x 1.2 cm left lower lobe pulmonary nodule concerning for neoplasm. Consider one of the following in 3 months for both low-risk and high-risk individuals: (a) repeat chest CT or (b) follow-up PET-CT. This recommendation follows the consensus statement: Guidelines for Management of Incidental Pulmonary Nodules Detected on  CT Images: From the Fleischner Society 2017; Radiology 2017; [phone]. 3. Probable biliary sludge lying dependently in the gallbladder. 4. Aortic atherosclerosis. 5. Additional incidental findings, as above. Electronically Signed   By: Vinnie Langton M.D.   On: 07/20/2020 15:23   CT Chest Wo Contrast  Result Date: 07/19/2020 CLINICAL DATA:  Dyspnea. Interstitial lung disease suspected. Altered mental status over the last couple of days. EXAM: CT CHEST WITHOUT CONTRAST TECHNIQUE: Multidetector CT imaging of the chest was performed following the standard protocol without IV contrast. COMPARISON:  Current and prior chest radiographs. FINDINGS: Cardiovascular: Heart normal in size. Coronary  artery calcifications. Aortic atherosclerosis. Mediastinum/Nodes: Enlarged right axillary lymph nodes, largest measuring 1.3 cm in short axis. There are shoddy subcentimeter neck base lymph nodes. There are several prominent to mildly enlarged mediastinal lymph nodes. Azygos level right paratracheal node measures 1.3 cm in short axis. Subcarinal node measures 1.8 cm in short axis. Probable mildly enlarged inferior right hilar lymph node, 1.3 cm in short axis. Trachea and esophagus are unremarkable. Lungs/Pleura: Lungs demonstrate interstitial thickening peripheral cystic spaces, with a predominance in the mid to upper lungs. There is an ill-defined 1.1 cm nodular opacity in the left lower lobe, image 91, series 3. No convincing pneumonia. No pulmonary edema. No pleural effusion or pneumothorax. Upper Abdomen: Enlarged spleen, 15 cm in greatest transverse dimension. Prominent to mildly enlarged lymph nodes in the upper abdomen, in the visualized retroperitoneum and along the Peri celiac chain. Low-density liver lesions consistent with cysts. Musculoskeletal: Skeletal structures are demineralized. There multiple vertebral compression fractures, T4, T8, T9 as well as L1, which appear chronic. No bone lesions. IMPRESSION: 1. Interstitial lung disease reflected by peripheral cystic change, interstitial thickening, areas of honeycombing and architectural distortion, with a mid to upper lung predominance. 2. 1.1 cm ill-defined nodular opacity in the left lower lobe. This could be inflammatory. Neoplastic disease is possible. Consider one of the following in 3 months for both low-risk and high-risk individuals: (a) repeat chest CT, (b) follow-up PET-CT, or (c) tissue sampling. This recommendation follows the consensus statement: Guidelines for Management of Incidental Pulmonary Nodules Detected on CT Images: From the Fleischner Society 2017; Radiology 2017; 284:228-243. No other findings in the lungs to suggest active  inflammation or infection. No pulmonary edema. 3. Enlarged lymph nodes, most apparent in the right axilla, milder adenopathy along the mediastinum and right hilum, with adenopathy also noted in the visualized upper abdomen. There is also splenomegaly. The combination of findings is concerning for a lymphoproliferative disorder including lymphoma. 4. Coronary artery calcifications and aortic atherosclerosis. Aortic Atherosclerosis (ICD10-I70.0). Electronically Signed   By: Lajean Manes M.D.   On: 07/19/2020 21:07   DG Chest Portable 1 View  Result Date: 07/19/2020 CLINICAL DATA:  Weakness. EXAM: PORTABLE CHEST 1 VIEW COMPARISON:  02/10/2011. FINDINGS: Cardiac silhouette is normal in size. Normal mediastinal and hilar contours. Lungs show bilateral interstitial thickening similar to the prior exam allowing for differences in radiographic technique. No lung consolidation. No pleural effusion or pneumothorax. Skeletal structures are grossly intact. IMPRESSION: 1. No acute cardiopulmonary disease. 2. Chronic bilateral interstitial thickening. Electronically Signed   By: Lajean Manes M.D.   On: 07/19/2020 19:56   [place] CORE BIOPSY (LYMPH NODES)  Result Date: 07/21/2020 INDICATION: 70 year old male presenting with weight loss, left lower lobe mass, and lymphadenopathy. EXAM: ULTRASOUND GUIDED core BIOPSY OF right axillary lymph node. MEDICATIONS: None. ANESTHESIA/SEDATION: Fentanyl 25 mcg IV; Versed 0.5 mg IV Moderate Sedation Time:  7 The patient was  continuously monitored during the procedure by the interventional radiology nurse under my direct supervision. PROCEDURE: The procedure, risks, benefits, and alternatives were explained to the patient. Questions regarding the procedure were encouraged and answered. The patient understands and consents to the procedure. The right axilla was prepped with chlorhexidine in a sterile fashion, and a sterile drape was applied covering the operative field. A sterile gown and  sterile gloves were used for the procedure. Preprocedure ultrasound evaluation demonstrated multiple prominent right axillary lymph nodes. The dominant node was selected for biopsy. The procedure was planned. Local anesthesia was provided with 1% Lidocaine. A small skin nick was made. Under direct ultrasound visualization, a total of 3, 18 gauge core biopsies were obtained. The samples were placed on saline soaked Telfa and sent to Pathology. Postprocedural imaging demonstrated no evidence of surrounding hematoma. Sterile bandage was placed. The patient tolerated procedure well was transferred back to floor in stable condition. COMPLICATIONS: None immediate. FINDINGS: Right axillary lymphadenopathy. IMPRESSION: Technically successful ultrasound-guided right axillary lymph node biopsy. Ruthann Cancer, MD [facility] Electronically Signed   By: Ruthann Cancer MD   On: 07/21/2020 10:49    PERFORMANCE STATUS (ECOG) : 2 - Symptomatic, <50% confined to bed  Review of Systems Unless otherwise noted, a complete review of systems is negative.  Physical Exam General: NAD Cardiovascular: regular rate and rhythm Extremities: no edema, no joint deformities Skin: no rashes Neurological: Weakness but otherwise nonfocal  IMPRESSION: Patient was an acute add-on to my clinic schedule today at Dr. Aletha Halim request.  I had a joint meeting with Dr. Rogue Bussing as we discussed the plan of care with patient and sister.  Patient initially said that he just wanted to go home and die.  He was not interested at all in receiving any further work-up, treatment, or returning to the hospital or clinic.  He was initially adamant that he did not want to be rehospitalized even if that meant decline and death.  I discussed with him the option of hospice at home and patient was initially agreeable to this.  However, after further consideration and speaking with chaplain and  sister, patient requested hospitalization and was at least willing to consider the possibility of cancer treatment.  We will hold on hospice referral and patient was transferred to the ER for evaluation and management.  I discussed CODE STATUS with patient and sister.  Patient stated clearly and repeatedly that he would not want to be resuscitated or have his life prolonged artificially on machines.  I signed a DNR order for him to take home.  PLAN: -Transfer to ER for evaluation and management of hypercalcemia, hyponatremia, hypotension -DNR/DNI -Chaplain consult -Recommend palliative care consult  Case and plan discussed with Dr. Rogue Bussing   Time Total: 45 minutes  Visit consisted of counseling and education dealing with the complex and emotionally intense issues of symptom management and palliative care in the setting of serious and potentially life-threatening illness.Greater than 50%  of this time was spent counseling and coordinating care related to the above assessment and plan.  Signed by: Altha Harm, PhD, NP-C

## 2020-08-03 NOTE — Progress Notes (Signed)
Received orders for 2 liter of NS each over 1 hour based in lab results. Pt is wearing Knee high TED hose. Patient refused going to inpatient status. PIV discontinued. Sister very upset, wanting pt to agree to hospitalization. Pt agreed to palliative care. DNR given to patient. Chaplain called to help . At time of discharge pt agreeable to being admitted to hospital. Per provider pt taken to the ED for admission.

## 2020-08-03 NOTE — Progress Notes (Signed)
Patient here for fluid infusion. No changes in clinical status.

## 2020-08-03 NOTE — Assessment & Plan Note (Addendum)
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  S/p IV fluids; plan repeat bisphosphonate/calcitonin [see below]  #Lymph node biopsy [right underarm core biopsy]-positive for lymphoma.  Discussed with Dr.Kraynie.  However unfortunately unable to subclassify further at this time.  We again will reach out to expedite the results.  #Borderline low blood pressures 94R to 74Y systolic-likely secondary to hypercalcemia/dehydration; as above.  See discussion below.   # Pancytopenia/splenomegaly/generalized lymphadenopathy--again concerning for lymphoma.  Await above work-up.   #Interstitial lung disease based on imaging / lung nodule ~1.1cm LLL/active smoker. Out pt follow up.  #Patient reluctant with admission to the hospital.  I had with had a long discussion with patient/sister regarding immediate prognosis with respect to hypercalcemia-is extremely poor without any acute interventions.  Also discussed long-term prognosis from lymphoma is unclear-given the pending results.  In general lymphomas are treated the goal for cure; however this would involve systemic therapy/frequent visits to the clinic/interventions.  Discussed with Josh.  After multiple back-and-forth conversations patient finally agreed to be admitted to hospital.  Patient sent to ER.  # 40 minutes face-to-face with the patient discussing the above plan of care; more than 50% of time spent on prognosis/ natural history; counseling and coordination.  DISPOSITION: # follow up TBD- Dr.B

## 2020-08-03 NOTE — ED Triage Notes (Signed)
First Nurse NOte:  Sent to ED from Cancer center for evaluation and management of hypercalcemia, hyponatremia, hypotension.  -DNR/DNI  Pateitn AAOx3.  Skin warm and dry . NAD

## 2020-08-03 NOTE — H&P (Signed)
History and Physical    Patrick Shepard:[id number] DOB: Feb 03, 1951 DOA: 08/03/2020  PCP: Mechele Claude, FNP    Patient coming from:  Oncologist's office   Chief Complaint:  Abnormal lab   HPI: Patrick Shepard is a 70 y.o. male seen in ed with complaints of abnormal labs including elevated calcium and generalized weakness.  Patient required admission for hypercalcemia 07/19/2020 through 07/25/2020 with admission calcium of 13.8 for which he received IV fluids, calcitonin, and zoledronic acid on 07/20/2020.  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  Patient went to his oncology appointment on 08/03/2020, the day of admission, and was found to have recurrence of hypercalcemia along with mild hypotension, so he was sent to the emergency department.  Patient reports he does have generalized weakness that has been present for days, is located all over, is constant, and is characterized as a fatigue.  Weakness is alleviated by nothing and exacerbated by exertion.  Patient denies focal weakness, headache, chest pain, palpitations, shortness of breath, cough, fevers, abdominal pain, vomiting, or bloody stool.  Patient did meet with palliative care as an outpatient on the date of admission and signed a DNR for CODE STATUS.  Pt has past medical history of Lymphoma with pancytopenia, hypercalcemia, COPD, left lower lobe lung mass.  ED Course:  Vitals:   08/03/20 1625  BP: (!) 99/58  Pulse: (!) 103  Resp: 17  Temp: 98.5 F (36.9 C)  SpO2: 100%  Patient's blood pressure was found to be slightly low at 87/62 but improved with IV fluids to 99/58; pulse range 73-103.  Labs include calcium 12.3 at oncology office and 11.9 in the ED, magnesium 1.6, creatinine 1.25 in the ED and 1.46 at oncologist office (with previous Cr 1.08), sodium  130, potassium 4.2, glucose 100, WBC 2.3, hemoglobin 8.2, platelets 140.  Patient was treated with IV fluids.   Review of Systems:  Review of Systems  Respiratory:  Negative for shortness of breath.   Cardiovascular:  Negative for chest pain and palpitations.  All other systems reviewed and are negative.   Past Medical History:  Diagnosis Date   Emphysema lung ([place])    Hypercalcemia    Lymphoma ([place]) 08/03/2020   Possible diagnosis. Lymph node biopsy on 07/21/20. Oncology noted: Lymph node biopsy [right underarm core biopsy]-positive for lymphoma.  Discussed with Dr.Kraynie.  However unfortunately unable to subclassify further at this time.  Awaiting second opinion at tertiary center.   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 Did not have knee replacement   LAPAROTOMY     done at age 69 for intestinal issues     reports that he has been smoking. He has been smoking an average of 1.00 packs per day. He has quit using smokeless tobacco. No history on file for alcohol use and drug use.  No Known Allergies  Family History  Problem Relation Age of Onset   COPD Mother    Alcoholism Father    Cirrhosis Father     Prior to Admission medications   Medication Sig Start Date End  Date Taking? Authorizing Provider  albuterol (VENTOLIN HFA) 108 (90 Base) MCG/ACT inhaler Inhale 2 puffs into the lungs every 6 (six) hours as needed for wheezing or shortness of breath. Patient taking differently: Inhale 3-4 puffs into the lungs 2 (two) times daily. 07/30/20  Yes Cammie Sickle, MD  magnesium oxide (MAG-OX) 400 (240 Mg) MG tablet Take 1 tablet (400 mg total) by mouth 2 (two) times daily. 07/25/20  Yes Regalado, Belkys A, MD  pantoprazole (PROTONIX) 40 MG tablet Take 1 tablet (40 mg total) by  mouth daily. 07/25/20 07/25/21 Yes Regalado, Belkys A, MD  potassium chloride SA (KLOR-CON) 20 MEQ tablet Take 1 tablet (20 mEq total) by mouth daily. 07/25/20  Yes Regalado, Belkys A, MD  traZODone (DESYREL) 50 MG tablet Take 0.5 tablets (25 mg total) by mouth at bedtime as needed for sleep. 07/25/20  Yes Regalado, Belkys A, MD  Vitamin D3 (VITAMIN D) 25 MCG tablet Take 1,000 Units by mouth daily.   Yes [provider]  calcium-vitamin D (OSCAL WITH D) 500-200 MG-UNIT tablet Take 1 tablet by mouth. Patient not taking: Reported on 08/03/2020    [provider]  feeding supplement (ENSURE ENLIVE / ENSURE PLUS) LIQD Take 237 mLs by mouth 2 (two) times daily between meals. 07/25/20   Regalado, Belkys A, MD  nystatin (MYCOSTATIN) 100000 UNIT/ML suspension Take 5 mLs (500,000 Units total) by mouth 4 (four) times daily. Patient not taking: No sig reported 07/25/20   Elmarie Shiley, MD    Physical Exam: Vitals:   08/03/20 1625  BP: (!) 99/58  Pulse: (!) 103  Resp: 17  Temp: 98.5 F (36.9 C)  SpO2: 100%   Physical Exam Vitals and nursing note reviewed.  Constitutional:      General: He is not in acute distress.    Appearance: Normal appearance. He is not ill-appearing or diaphoretic.  HENT:     Head: Normocephalic and atraumatic.     Right Ear: External ear normal.     Left Ear: External ear normal.     Nose: Nose normal.  Eyes:     Extraocular Movements: Extraocular movements intact.     Pupils: Pupils are equal, round, and reactive to light.  Cardiovascular:     Rate and Rhythm: Normal rate and regular rhythm.     Pulses: Normal pulses.     Heart sounds: Normal heart sounds.  Pulmonary:     Effort: Pulmonary effort is normal.     Breath sounds: Normal breath sounds.  Abdominal:     General: Bowel sounds are normal. There is no distension.     Palpations: Abdomen is soft.     Tenderness: There is no abdominal tenderness. There is no guarding.    Musculoskeletal:        Back:     Comments: Large bruise.  Skin:    Findings: Bruising present.  Neurological:     General: No focal deficit present.     Mental Status: He is oriented to person, place, and time.  Psychiatric:        Mood and Affect: Mood normal.        Behavior: Behavior normal.    Labs on Admission: I have personally reviewed following labs and imaging studies  No results for input(s): CKTOTAL, CKMB, TROPONINI in the last 72 hours. Lab Results  Component Value Date   WBC 2.3 (L) 08/03/2020   HGB 8.2 (L) 08/03/2020   HCT 24.1 (L) 08/03/2020   MCV 90.3  08/03/2020   PLT 140 (L) 08/03/2020    Recent Labs  Lab 08/03/20 1628  NA 130*  K 4.2  CL 94*  CO2 32  BUN 36*  CREATININE 1.25*  CALCIUM 11.9*  PROT 4.9*  BILITOT 0.8  ALKPHOS 113  ALT 37  AST 77*  GLUCOSE 100*   No results found for: CHOL, HDL, LDLCALC, TRIG No results found for: DDIMER Invalid input(s): POCBNP  Urinalysis    Component Value Date/Time   COLORURINE YELLOW (A) 07/19/2020 2044   APPEARANCEUR CLEAR (A) 07/19/2020 2044   LABSPEC 1.011 07/19/2020 2044   PHURINE 5.0 07/19/2020 2044   GLUCOSEU NEGATIVE 07/19/2020 2044   HGBUR SMALL (A) 07/19/2020 2044   BILIRUBINUR NEGATIVE 07/19/2020 2044   KETONESUR NEGATIVE 07/19/2020 2044   PROTEINUR NEGATIVE 07/19/2020 2044   NITRITE NEGATIVE 07/19/2020 2044   LEUKOCYTESUR NEGATIVE 07/19/2020 2044   COVID-19 Labs No results for input(s): DDIMER, FERRITIN, LDH, CRP in the last 72 hours. Lab Results  Component Value Date   [place] 07/19/2020   Radiological Exams on Admission: No results found.  EKG: Independently reviewed.  Sinus rhythm 92 with PVCs.  Assessment/Plan Principal Problem:   Hypercalcemia Active Problems:   Emphysema lung ([facility])   Nicotine dependence, cigarettes, uncomplicated   Pancytopenia ([facility])   Mass of lower lobe of left lung   Hypotension due to hypovolemia   Hypomagnesemia   Hypercalcemia: Attribute to patient's  existing diagnosis of lymphoma, however patient also taking calcium supplementation.In the emergency room patient received 1 L of normal saline, along with magnesium replacement.  Currently we will hold patient's calcium supplementation and continue with IV fluid.  Diuretic therapy as needed.  Patient already received bisphosphonate therapy and Will start calcitonin iv as needed.  Emphysema/tobacco abuse: Stable, nicotine patch.  Pancytopenia: Stable we will follow.  Mass of left lower lobe/ lymphoma: S/p biopsy:Possible diagnosis. Lymph node biopsy on 07/21/20. Oncology noted: Lymph node biopsy [right underarm core biopsy]-positive for lymphoma.  Discussed with Dr.Kraynie.  However unfortunately unable to subclassify further at this time.  Awaiting second opinion at tertiary center. Oncology consult per am team.   Hypotension: Attribute to hypovolemia and dehydration. We will cont to hydrate overnight.   Hypomagnesemia: We will replace and follow levels.    DVT prophylaxis:  Heparin  Code Status:  DNR  Family Communication:  [name] (Sister)  [phone] (Mobile)   Disposition Plan:  Home  Consults called:  None  Admission status: Inpatient   Para Skeans MD [facility] [phone] How to contact the [facility] Attending or Consulting provider [place] or covering provider during after hours [place], for this patient.    Check the care team in [facility] and look for a) attending/consulting [facility] provider listed and b) the [facility] team listed Log into [email or web address] and use [place]'s universal password to access. If you do not have the password, please contact the hospital operator. Locate the [facility] provider you are looking for under [facility] and page to a number that you can be directly reached. If you still have difficulty reaching the provider, please page the [facility] (Director on Call) for the Hospitalists listed on amion for assistance. [email or web address] Password  [facility] 08/03/2020, 9:07 PM

## 2020-08-04 ENCOUNTER — Encounter: Payer: Self-pay | Admitting: Internal Medicine

## 2020-08-04 DIAGNOSIS — Z515 Encounter for palliative care: Secondary | ICD-10-CM

## 2020-08-04 LAB — MAGNESIUM: Magnesium: 2 mg/dL (ref 1.7–2.4)

## 2020-08-04 LAB — CBC WITH DIFFERENTIAL/PLATELET
Abs Immature Granulocytes: 0.07 10*3/uL (ref 0.00–0.07)
Basophils Absolute: 0 10*3/uL (ref 0.0–0.1)
Basophils Relative: 1 %
Eosinophils Absolute: 0 10*3/uL (ref 0.0–0.5)
Eosinophils Relative: 1 %
HCT: 24.2 % — ABNORMAL LOW (ref 39.0–52.0)
Hemoglobin: 8.3 g/dL — ABNORMAL LOW (ref 13.0–17.0)
Immature Granulocytes: 4 %
Lymphocytes Relative: 14 %
Lymphs Abs: 0.3 10*3/uL — ABNORMAL LOW (ref 0.7–4.0)
MCH: 31.8 pg (ref 26.0–34.0)
MCHC: 34.3 g/dL (ref 30.0–36.0)
MCV: 92.7 fL (ref 80.0–100.0)
Monocytes Absolute: 0.1 10*3/uL (ref 0.1–1.0)
Monocytes Relative: 7 %
Neutro Abs: 1.4 10*3/uL — ABNORMAL LOW (ref 1.7–7.7)
Neutrophils Relative %: 73 %
Platelets: 132 10*3/uL — ABNORMAL LOW (ref 150–400)
RBC: 2.61 MIL/uL — ABNORMAL LOW (ref 4.22–5.81)
RDW: 20.3 % — ABNORMAL HIGH (ref 11.5–15.5)
WBC: 1.9 10*3/uL — ABNORMAL LOW (ref 4.0–10.5)
nRBC: 0 % (ref 0.0–0.2)

## 2020-08-04 LAB — BASIC METABOLIC PANEL
Anion gap: 6 (ref 5–15)
BUN: 30 mg/dL — ABNORMAL HIGH (ref 8–23)
CO2: 29 mmol/L (ref 22–32)
Calcium: 11 mg/dL — ABNORMAL HIGH (ref 8.9–10.3)
Chloride: 95 mmol/L — ABNORMAL LOW (ref 98–111)
Creatinine, Ser: 1.17 mg/dL (ref 0.61–1.24)
GFR, Estimated: >60 mL/min (ref >60)
Glucose, Bld: 122 mg/dL — ABNORMAL HIGH (ref 70–99)
Potassium: 3.7 mmol/L (ref 3.5–5.1)
Sodium: 130 mmol/L — ABNORMAL LOW (ref 135–145)

## 2020-08-04 MED ORDER — SODIUM CHLORIDE 0.9 % IV SOLN
3.0000 mg | Freq: Once | INTRAVENOUS | Status: AC
  Administered 2020-08-04: 10:00:00 3 mg via INTRAVENOUS
  Filled 2020-08-04: qty 3.75

## 2020-08-04 MED ORDER — SODIUM CHLORIDE 0.9 % IV SOLN: INTRAVENOUS | Status: DC

## 2020-08-04 MED ORDER — PREDNISONE 50 MG PO TABS
100.0000 mg | ORAL_TABLET | Freq: Every day | ORAL | Status: DC
  Administered 2020-08-04 – 2020-08-07 (×4): 100 mg via ORAL
  Filled 2020-08-04 (×4): qty 2

## 2020-08-04 MED ORDER — CALCITONIN (SALMON) 200 UNIT/ML IJ SOLN
4.0000 [IU]/kg | Freq: Two times a day (BID) | INTRAMUSCULAR | Status: AC
Start: 2020-08-04 — End: 2020-08-06
  Administered 2020-08-04 – 2020-08-06 (×6): 252 [IU] via INTRAMUSCULAR
  Filled 2020-08-04 (×7): qty 1.26

## 2020-08-04 MED ORDER — ONDANSETRON HCL 4 MG/2ML IJ SOLN
4.0000 mg | Freq: Four times a day (QID) | INTRAMUSCULAR | Status: DC | PRN
  Administered 2020-08-05: 06:00:00 4 mg via INTRAVENOUS

## 2020-08-04 NOTE — Assessment & Plan Note (Addendum)
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 Continue calcitonin for now.  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  #Prognosis: Again reviewed with the patient and sister-extremely poor given the aggressive nature of lymphoma/also poor prognostic factors-hypercalcemia.  Patient poor candidate for therapy; agree with hospice.  Given the recalcitrant hypercalcemia-I suspect patient's calcium will start going up in the next 1 week or so-which untreated would be fatal.  Patient wants a trial of Hospice at home patient/sister in agreement.  Discussed with Dr.Sreenath.

## 2020-08-04 NOTE — Consult Note (Signed)
[place]  [phone] [phone]   Name: Patrick Shepard Date: 08/04/2020 MRN: [id number]  DOB: 1950/10/28  Patient Care Team: Mechele Claude, FNP as PCP - General (Family Medicine)    REASON FOR CONSULTATION: Patrick Shepard is a 70 y.o. male who was hospitalized 07/19/2020 to 07/25/2020 with hypercalcemia.  Patient had progressive weakness and weight loss over the past 6 months.  CT of the chest showed a left lower lobe opacity with enlarged axillary and mediastinal lymphadenopathy.  He underwent ultrasound-guided right axillary lymph node biopsy on 5/31, with preliminary report suggestive of lymphoma.  Patient was readmitted on 08/03/2020 with recurrent hypercalcemia and hypotension.  Palliative care was consulted help address goals.  SOCIAL HISTORY:     reports that he has been smoking. He has been smoking an average of 1.00 packs per day. He has quit using smokeless tobacco.  Patient is unmarried.  He lives at home alone.  He has several sisters are involved in his care.  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 Lymph node biopsy on 07/21/20. Oncology noted: Lymph node biopsy [right underarm core biopsy]-positive for lymphoma.  Discussed with Dr.Kraynie.  However unfortunately unable to subclassify further at this time.  Awaiting second opinion at tertiary center.   Mass of lower lobe of left lung 07/20/2020   Pancytopenia ([place]) 07/19/2020   Seizure in childhood [facility])    no seizure for many years   Tobacco abuse     PAST SURGICAL HISTORY:  Past Surgical History:  Procedure Laterality Date   KNEE SURGERY Left    for injury. Did not have knee replacement   LAPAROTOMY     done at age 5 for intestinal issues    HEMATOLOGY/ONCOLOGY  HISTORY:  Oncology History   No history exists.    ALLERGIES:  has No Known Allergies.  MEDICATIONS:  Current Facility-Administered Medications  Medication Dose Route Frequency Provider Last Rate Last Admin   acetaminophen (TYLENOL) tablet 650 mg  650 mg Oral Q6H PRN Para Skeans, MD       Or   acetaminophen (TYLENOL) suppository 650 mg  650 mg Rectal Q6H PRN Para Skeans, MD       albuterol (PROVENTIL) (2.5 MG/3ML) 0.083% nebulizer solution 3 mL  3 mL Nebulization Q6H PRN Para Skeans, MD       calcitonin (MIACALCIN) injection 252 Units  4 Units/kg Intramuscular BID Cammie Sickle, MD   252 Units at 08/04/20 1012   nicotine (NICODERM CQ - dosed in mg/24 hours) patch 21 mg  21 mg Transdermal Daily Para Skeans, MD       ondansetron (ZOFRAN) injection 4 mg  4 mg Intravenous Q6H PRN Sreenath, Sudheer B, MD       pantoprazole (PROTONIX) EC tablet 40 mg  40 mg Oral Daily Florina Ou V, MD   40 mg at 08/04/20 1002   predniSONE (DELTASONE) tablet 100 mg  100 mg Oral Q breakfast Charlaine Dalton R, MD   100 mg at 08/04/20 1003    VITAL SIGNS: BP 97/61 (BP Location: Right Arm)   Pulse 100   Temp 97.6 F (36.4 C) (Oral)   Resp 19   SpO2 100%  There were no vitals filed for this visit.  Estimated body mass index is 21.04 kg/m as calculated from the following:   Height as of 07/19/20: _0  (1.727 m).   Weight as of an earlier encounter on 08/03/20: 138 lb 6.4 oz (62.8 kg).  LABS: CBC:    Component Value Date/Time   WBC 1.9 (L) 08/04/2020 0942   HGB 8.3 (L) 08/04/2020 0942   HCT 24.2 (L) 08/04/2020 0942   PLT 132 (L) 08/04/2020 0942   MCV 92.7 08/04/2020 0942   NEUTROABS 1.4 (L) 08/04/2020 0942   LYMPHSABS 0.3 (L) 08/04/2020 0942   MONOABS 0.1 08/04/2020 0942   EOSABS 0.0 08/04/2020 0942   BASOSABS 0.0 08/04/2020 0942   Comprehensive Metabolic Panel:    Component Value Date/Time   NA 130 (L) 08/04/2020 0942   K 3.7 08/04/2020 0942   CL 95 (L) 08/04/2020 0942    CO2 29 08/04/2020 0942   BUN 30 (H) 08/04/2020 0942   CREATININE 1.17 08/04/2020 0942   GLUCOSE 122 (H) 08/04/2020 0942   CALCIUM 11.0 (H) 08/04/2020 0942   CALCIUM 11.1 (H) 07/20/2020 0607   AST 77 (H) 08/03/2020 1628   ALT 37 08/03/2020 1628   ALKPHOS 113 08/03/2020 1628   BILITOT 0.8 08/03/2020 1628   PROT 4.9 (L) 08/03/2020 1628   ALBUMIN 2.9 (L) 08/03/2020 1628    RADIOGRAPHIC STUDIES: CT ABDOMEN PELVIS WO CONTRAST  Result Date: 07/20/2020 CLINICAL DATA:  70 year old male with history of lymphadenopathy. EXAM: CT ABDOMEN AND PELVIS WITHOUT CONTRAST TECHNIQUE: Multidetector CT imaging of the abdomen and pelvis was performed following the standard protocol without IV contrast. COMPARISON:  No priors. FINDINGS: Lower chest: 1.6 x 1.2 cm left lower lobe pulmonary nodule (axial image 3 of series 4). Atherosclerotic calcifications in the descending thoracic aorta as well as the left main, left anterior descending and right coronary arteries. Mild calcifications of the aortic valve. Hepatobiliary: Low-attenuation lesions are noted in the liver, incompletely characterized on today's non-contrast CT examination, but statistically likely to represent cysts, largest of which is in segment 4A (axial image 19 of series 2) measuring 4.0 x 2.6 cm. Amorphous intermediate attenuation material lying dependently in the gallbladder, likely to represent biliary sludge. Gallbladder is nearly decompressed. Pancreas: No definite pancreatic mass or peripancreatic fluid collections or inflammatory changes are noted on today's noncontrast CT examination. Spleen: Spleen is enlarged measuring 15.3 x 6.4 x 13.8 cm (estimated splenic volume of 665 mL) . Adrenals/Urinary Tract: 3 mm nonobstructive calculus in the interpolar collecting system of the left kidney. No additional calculi are noted within the right renal collecting system, along the course of either ureter, or within the lumen of the urinary bladder. No  hydroureteronephrosis. Unenhanced appearance of the kidneys is otherwise unremarkable. Bilateral adrenal glands are normal in appearance. Urinary bladder is normal in appearance. Stomach/Bowel: Unenhanced appearance of the stomach is normal. No pathologic dilatation of small bowel or colon. The appendix is not confidently identified and may be surgically absent. Regardless, there are no inflammatory changes noted adjacent to the cecum to suggest the presence of an acute appendicitis at this time. Vascular/Lymphatic: Aortic atherosclerosis. Multiple enlarged retroperitoneal lymph nodes, largest of which is in the left para-aortic nodal station adjacent to the left renal hilum (axial image 30 of series 3) measuring 1.8 x 1.5 cm. Reproductive: Prostate gland and seminal vesicles are unremarkable in appearance. Other: No significant volume of ascites.  No pneumoperitoneum. Musculoskeletal: There are no aggressive appearing lytic or blastic lesions noted in the visualized portions of the skeleton. Chronic  appearing compression fracture of T12 with 25% loss of anterior vertebral body height. IMPRESSION: 1. Extensive retroperitoneal lymphadenopathy. There is also splenomegaly. Clinical correlation for signs and symptoms of lymphoproliferative disorder is recommended. 2. 1.6 x 1.2 cm left lower lobe pulmonary nodule concerning for neoplasm. Consider one of the following in 3 months for both low-risk and high-risk individuals: (a) repeat chest CT or (b) follow-up PET-CT. This recommendation follows the consensus statement: Guidelines for Management of Incidental Pulmonary Nodules Detected on CT Images: From the Fleischner Society 2017; Radiology 2017; 284:228-243. 3. Probable biliary sludge lying dependently in the gallbladder. 4. Aortic atherosclerosis. 5. Additional incidental findings, as above. Electronically Signed   By: Vinnie Langton M.D.   On: 07/20/2020 15:23   CT Chest Wo Contrast  Result Date:  07/19/2020 CLINICAL DATA:  Dyspnea. Interstitial lung disease suspected. Altered mental status over the last couple of days. EXAM: CT CHEST WITHOUT CONTRAST TECHNIQUE: Multidetector CT imaging of the chest was performed following the standard protocol without IV contrast. COMPARISON:  Current and prior chest radiographs. FINDINGS: Cardiovascular: Heart normal in size. Coronary artery calcifications. Aortic atherosclerosis. Mediastinum/Nodes: Enlarged right axillary lymph nodes, largest measuring 1.3 cm in short axis. There are shoddy subcentimeter neck base lymph nodes. There are several prominent to mildly enlarged mediastinal lymph nodes. Azygos level right paratracheal node measures 1.3 cm in short axis. Subcarinal node measures 1.8 cm in short axis. Probable mildly enlarged inferior right hilar lymph node, 1.3 cm in short axis. Trachea and esophagus are unremarkable. Lungs/Pleura: Lungs demonstrate interstitial thickening peripheral cystic spaces, with a predominance in the mid to upper lungs. There is an ill-defined 1.1 cm nodular opacity in the left lower lobe, image 91, series 3. No convincing pneumonia. No pulmonary edema. No pleural effusion or pneumothorax. Upper Abdomen: Enlarged spleen, 15 cm in greatest transverse dimension. Prominent to mildly enlarged lymph nodes in the upper abdomen, in the visualized retroperitoneum and along the Peri celiac chain. Low-density liver lesions consistent with cysts. Musculoskeletal: Skeletal structures are demineralized. There multiple vertebral compression fractures, T4, T8, T9 as well as L1, which appear chronic. No bone lesions. IMPRESSION: 1. Interstitial lung disease reflected by peripheral cystic change, interstitial thickening, areas of honeycombing and architectural distortion, with a mid to upper lung predominance. 2. 1.1 cm ill-defined nodular opacity in the left lower lobe. This could be inflammatory. Neoplastic disease is possible. Consider one of the  following in 3 months for both low-risk and high-risk individuals: (a) repeat chest CT, (b) follow-up PET-CT, or (c) tissue sampling. This recommendation follows the consensus statement: Guidelines for Management of Incidental Pulmonary Nodules Detected on CT Images: From the Fleischner Society 2017; Radiology 2017; 284:228-243. No other findings in the lungs to suggest active inflammation or infection. No pulmonary edema. 3. Enlarged lymph nodes, most apparent in the right axilla, milder adenopathy along the mediastinum and right hilum, with adenopathy also noted in the visualized upper abdomen. There is also splenomegaly. The combination of findings is concerning for a lymphoproliferative disorder including lymphoma. 4. Coronary artery calcifications and aortic atherosclerosis. Aortic Atherosclerosis (ICD10-I70.0). Electronically Signed   By: Lajean Manes M.D.   On: 07/19/2020 21:07   DG Chest Portable 1 View  Result Date: 07/19/2020 CLINICAL DATA:  Weakness. EXAM: PORTABLE CHEST 1 VIEW COMPARISON:  02/10/2011. FINDINGS: Cardiac silhouette is normal in size. Normal mediastinal and hilar contours. Lungs show bilateral interstitial thickening similar to the prior exam allowing for differences in radiographic technique. No lung consolidation. No pleural effusion or pneumothorax.  Skeletal structures are grossly intact. IMPRESSION: 1. No acute cardiopulmonary disease. 2. Chronic bilateral interstitial thickening. Electronically Signed   By: Lajean Manes M.D.   On: 07/19/2020 19:56   [place] CORE BIOPSY (LYMPH NODES)  Result Date: 07/21/2020 INDICATION: 70 year old male presenting with weight loss, left lower lobe mass, and lymphadenopathy. EXAM: ULTRASOUND GUIDED core BIOPSY OF right axillary lymph node. MEDICATIONS: None. ANESTHESIA/SEDATION: Fentanyl 25 mcg IV; Versed 0.5 mg IV Moderate Sedation Time:  7 The patient was continuously monitored during the procedure by the interventional radiology nurse under my  direct supervision. PROCEDURE: The procedure, risks, benefits, and alternatives were explained to the patient. Questions regarding the procedure were encouraged and answered. The patient understands and consents to the procedure. The right axilla was prepped with chlorhexidine in a sterile fashion, and a sterile drape was applied covering the operative field. A sterile gown and sterile gloves were used for the procedure. Preprocedure ultrasound evaluation demonstrated multiple prominent right axillary lymph nodes. The dominant node was selected for biopsy. The procedure was planned. Local anesthesia was provided with 1% Lidocaine. A small skin nick was made. Under direct ultrasound visualization, a total of 3, 18 gauge core biopsies were obtained. The samples were placed on saline soaked Telfa and sent to Pathology. Postprocedural imaging demonstrated no evidence of surrounding hematoma. Sterile bandage was placed. The patient tolerated procedure well was transferred back to floor in stable condition. COMPLICATIONS: None immediate. FINDINGS: Right axillary lymphadenopathy. IMPRESSION: Technically successful ultrasound-guided right axillary lymph node biopsy. Ruthann Cancer, MD [facility] Electronically Signed   By: Ruthann Cancer MD   On: 07/21/2020 10:49    PERFORMANCE STATUS (ECOG) : 2 - Symptomatic, <50% confined to bed  Review of Systems Unless otherwise noted, a complete review of systems is negative.  Physical Exam General: NAD Pulmonary: Unlabored Extremities: no edema, no joint deformities Skin: no rashes Neurological: Weakness but otherwise nonfocal  IMPRESSION: I met patient briefly in the clinic yesterday as an add-on to discuss goals.  At that point, he was frustrated appearing and vacillated on whether to pursue hospice at home versus hospitalization.  Today, I reintroduced palliative care.  Patient seemed less frustrated today.   He verbalized agreement with current scope of treatment.  He is still unclear on whether he would want to pursue cancer treatment as he says "we all have to die sometime."  Biopsy results are still pending but it is possible that his lymphoma could be highly treatable.  Will plan to have more in-depth conversations regarding goals once work-up is completed and treatment options have been introduced by medical oncology.  Patient is a DNR/DNI.  PLAN: -Continue current scope of treatment -DNR/DNI -Will follow   Patient expressed understanding and was in agreement with this plan. He also understands that He can call the clinic at any time with any questions, concerns, or complaints.     Time Total: 30 minutes  Visit consisted of counseling and education dealing with the complex and emotionally intense issues of symptom management and palliative care in the setting of serious and potentially life-threatening illness.Greater than 50%  of this time was spent counseling and coordinating care related to the above assessment and plan.  Signed by: Altha Harm, PhD, NP-C

## 2020-08-04 NOTE — Progress Notes (Signed)
Was read PROGRESS NOTE    Patrick Shepard  VZC:[id number] DOB: [birth year] DOA: 08/03/2020 PCP: Mechele Claude, FNP   Brief Narrative:  70 y.o. male seen in ed with complaints of abnormal labs including elevated calcium and generalized weakness.  Patient required admission for hypercalcemia 07/19/2020 through 07/25/2020 with admission calcium of 13.8 for which he received IV fluids, calcitonin, and zoledronic acid on 07/20/2020.  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  Patient went to his oncology appointment on 08/03/2020, the day of admission, and was found to have recurrence of hypercalcemia along with mild hypotension, so he was sent to the emergency department.  Patient reports he does have generalized weakness that has been present for days, is located all over, is constant, and is characterized as a fatigue.  Weakness is alleviated by nothing and exacerbated by exertion.  Patient denies focal weakness, headache, chest pain, palpitations, shortness of breath, cough, fevers, abdominal pain, vomiting, or bloody stool.  Patient did meet with palliative care as an outpatient on the date of admission and signed a DNR for CODE STATUS.  Patient was seen in consultation by oncology.  Initially the plan was to send the patient home potentially with hospice care as he using any kind of treatment however after repeated discussion the patient has agreed to stay.  Palliative care and oncology both following.  Patient on calcitonin and intravenous fluids.   Assessment & Plan:   Principal Problem:   Hypercalcemia Active Problems:   Emphysema lung ([facility])   Pancytopenia ([facility])   Mass of lower lobe of left lung   Hypotension due to hypovolemia   Hypomagnesemia   Nicotine dependence, cigarettes, uncomplicated   Palliative care  encounter  Hypercalcemia: Recent admission for the same.  Suspected secondary to underlying malignancy.  Hypercalcemia mild but symptomatic. Plan: Calcitonin x6 doses Prednisone 100 mg daily x5 doses per oncology Status post 1 dose zoledronic acid 3 mg Normal saline 100 cc/h Monitor daily calcium Oncology follow-up   Emphysema/tobacco abuse: Stable, nicotine patch.   Pancytopenia: Stable we will follow.   Mass of left lower lobe/ lymphoma: S/p biopsy:Possible diagnosis. Lymph node biopsy on 07/21/20. Oncology noted: Lymph node biopsy [right underarm core biopsy]-positive for lymphoma.  Discussed with Dr.Kraynie.  However unfortunately unable to subclassify further at this time.  Pathology is confirmed this is not a typical appearance of Hodgkin's lymphoma or carcinoma.  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  Pathology currently under review by [facility].  Official read is pending.   Hypotension: Attribute to hypovolemia and dehydration. Continue to manage fluids   Hypomagnesemia: We will replace and follow levels.    DVT prophylaxis: SCDs Code Status: DNR Family Communication: Sister [name] [phone] on 6/14 Disposition Plan: Status is: Inpatient  Remains inpatient appropriate because:Inpatient level of care appropriate due to severity of illness  Dispo: The patient is from: Home              Anticipated d/c is to: Home              Patient currently is not medically stable to d/c.   Difficult to place patient No   Possible discharge in 24 hours of calcium trending down and cleared for discharge by oncology    Level of care: Med-Surg  Consultants:  Oncology Palliative care  Procedures:  None  Antimicrobials:  None   Subjective: Seen and examined.  Endorses frustration about continued hospital stay.  No pain complaints.  Objective: Vitals:   08/04/20 0536 08/04/20 0813 08/04/20 0816 08/04/20 1214  BP: 107/72 97/76 97/61  105/66  Pulse: 96 92 100 (!) 55  Resp:  18 19  16   Temp: (!) 97.3 F (36.3 C) 97.6 F (36.4 C)  97.6 F (36.4 C)  TempSrc: Oral Oral  Oral  SpO2: 96% 100%  99%    Intake/Output Summary (Last 24 hours) at 08/04/2020 1249 Last data filed at 08/04/2020 1235 Gross per 24 hour  Intake 240 ml  Output 2750 ml  Net -2510 ml   There were no vitals filed for this visit.  Examination:  General exam: Appears calm and comfortable  Respiratory system: Clear to auscultation. Respiratory effort normal. Cardiovascular system: S1 & S2 heard, RRR. No JVD, murmurs, rubs, gallops or clicks. No pedal edema. Gastrointestinal system: Abdomen is nondistended, soft and nontender. No organomegaly or masses felt. Normal bowel sounds heard. Central nervous system: Alert and oriented. No focal neurological deficits. Extremities: Symmetric 5 x 5 power. Skin: No rashes, lesions or ulcers Psychiatry: Judgement and insight appear normal. Mood & affect appropriate.     Data Reviewed: I have personally reviewed following labs and imaging studies  CBC: Recent Labs  Lab 07/30/20 1523 08/03/20 1628 08/04/20 0942  WBC 2.4* 2.3* 1.9*  NEUTROABS 1.8 1.6* 1.4*  HGB 8.2* 8.2* 8.3*  HCT 23.7* 24.1* 24.2*  MCV 88.8 90.3 92.7  PLT 114* 140* 614*   Basic Metabolic Panel: Recent Labs  Lab 07/30/20 1523 07/31/20 0929 08/03/20 1143 08/03/20 1628 08/03/20 1655 08/04/20 0942  NA 128* 130* 128* 130*  --  130*  K 4.9 4.3 4.4 4.2  --  3.7  CL 87* 90* 90* 94*  --  95*  CO2 31 29 30  32  --  29  GLUCOSE 84 93 103* 100*  --  122*  BUN 37* 33* 37* 36*  --  30*  CREATININE 1.24 1.08 1.46* 1.25*  --  1.17  CALCIUM 12.7* 11.8* 12.3* 11.9*  --  11.0*  MG 1.8  --   --   --  1.6* 2.0  PHOS  --   --   --   --  3.7  --    GFR: Estimated Creatinine Clearance: 52.9 mL/min (by C-G formula based on SCr of 1.17 mg/dL). Liver Function Tests: Recent Labs  Lab 07/30/20 1523 08/03/20 1628  AST 70* 77*  ALT 38 37  ALKPHOS 101 113  BILITOT 0.9 0.8  PROT 5.3*  4.9*  ALBUMIN 3.0* 2.9*   No results for input(s): LIPASE, AMYLASE in the last 168 hours. No results for input(s): AMMONIA in the last 168 hours. Coagulation Profile: No results for input(s): INR, PROTIME in the last 168 hours. Cardiac Enzymes: No results for input(s): CKTOTAL, CKMB, CKMBINDEX, TROPONINI in the last 168 hours. BNP (last 3 results) No results for input(s): PROBNP in the last 8760 hours. HbA1C: No results for input(s): HGBA1C in the last 72 hours. CBG: No results for input(s): GLUCAP in the last 168 hours. Lipid Profile: No results for input(s): CHOL, HDL, LDLCALC, TRIG, CHOLHDL, LDLDIRECT in the last 72 hours. Thyroid Function Tests: No results for input(s): TSH, T4TOTAL, FREET4, T3FREE, THYROIDAB in the last 72 hours. Anemia Panel: No results for input(s): VITAMINB12, FOLATE, FERRITIN, TIBC, IRON, RETICCTPCT in the last 72 hours. Sepsis Labs: No results for input(s): PROCALCITON, LATICACIDVEN in the last 168 hours.  Recent Results (from the past 240 hour(s))  SARS CORONAVIRUS 2 (TAT 6-24 HRS) Nasopharyngeal Nasopharyngeal Swab     Status: None   Collection Time: 08/03/20  4:55 PM   Specimen: Nasopharyngeal Swab  Result Value Ref Range Status   SARS Coronavirus 2 NEGATIVE NEGATIVE Final    Comment: (NOTE) SARS-CoV-2 target nucleic acids are NOT DETECTED.  The SARS-CoV-2 RNA is generally detectable in upper and lower respiratory specimens during the acute phase of infection. Negative results do not preclude SARS-CoV-2 infection, do not rule out co-infections with other pathogens, and should not be used as the sole basis for treatment or other patient management decisions. Negative results must be combined with clinical observations, patient history, and epidemiological information. The expected result is Negative.  Fact Sheet for Patients: SugarRoll.be  Fact Sheet for Healthcare  Providers: https://www.woods-mathews.com/  This test is not yet approved or cleared by the [place] FDA and  has been authorized for detection and/or diagnosis of SARS-CoV-2 by FDA under an Emergency Use Authorization (EUA). This EUA will remain  in effect (meaning this test can be used) for the duration of the COVID-19 declaration under Se ction 564(b)(1) of the Act, 21 U.S.C. section 360bbb-3(b)(1), unless the authorization is terminated or revoked sooner.  Performed at [facility], [place] [address] [place], [place] [address]          Radiology Studies: No results found.      Scheduled Meds:  calcitonin  4 Units/kg Intramuscular BID   nicotine  21 mg Transdermal Daily   pantoprazole  40 mg Oral Daily   predniSONE  100 mg Oral Q breakfast   Continuous Infusions:  sodium chloride       LOS: 1 day    Time spent: 25 minutes    Sidney Ace, MD [facility] Pager [phone]  If 7PM-7AM, please contact night-coverage 08/04/2020, 12:49 PM

## 2020-08-04 NOTE — Progress Notes (Signed)
Patient with episode of emesis after eating breakfast.  He states that this frequently happens after he eats.  MD notified and prn for nausea requested.  Will continue to monitor

## 2020-08-04 NOTE — Consult Note (Signed)
[place] NOTE  Patient Care Team: Mechele Claude, FNP as PCP - General (Family Medicine)  CHIEF COMPLAINTS/PURPOSE OF CONSULTATION: Hypercalcemia  HISTORY OF PRESENTING ILLNESS:  Patrick Shepard 70 y.o.  male patient with newly diagnosed lymphoma; and hypercalcemia is currently admitted to hospital for uncontrolled hypercalcemia.  Patient was recently discharged from hospital after a right axillary for biopsy-positive for lymphoma. Patient also was treated for hypercalcemia during admission. However unable to subclassify this any further at this time.    Patient was supported by IV fluids; repeat bisphosphonates on 6/10.  However patient continued to have elevated calcium/hypotension/worsening renal function with creatinine up to 1.3 needing hospitalization.  Patient admits to nausea and episode of vomiting this morning.  Mild abdominal discomfort.  Otherwise no chills no fevers.  He is eager to go home.   Review of Systems  Constitutional:  Positive for malaise/fatigue and weight loss. Negative for chills, diaphoresis and fever.  HENT:  Negative for nosebleeds and sore throat.   Eyes:  Negative for double vision.  Respiratory:  Negative for cough, hemoptysis, sputum production, shortness of breath and wheezing.   Cardiovascular:  Negative for chest pain, palpitations, orthopnea and leg swelling.  Gastrointestinal:  Positive for abdominal pain and nausea. Negative for blood in stool, constipation, diarrhea, heartburn, melena and vomiting.  Genitourinary:  Negative for dysuria, frequency and urgency.  Musculoskeletal:  Negative for back pain and joint pain.  Skin: Negative.  Negative for itching and rash.  Neurological:  Positive for weakness. Negative for dizziness, tingling, focal weakness and headaches.  Endo/Heme/Allergies:  Does not bruise/bleed easily.  Psychiatric/Behavioral:  Negative for depression. The patient is not nervous/anxious and does not  have insomnia.     MEDICAL HISTORY:  Past Medical History:  Diagnosis Date   Emphysema lung ([place])    Hypercalcemia    Lymphoma ([place]) 08/03/2020   Possible diagnosis. Lymph node biopsy on 07/21/20. Oncology noted: Lymph node biopsy [right underarm core biopsy]-positive for lymphoma.  Discussed with Dr.Kraynie.  However unfortunately unable to subclassify further at this time.  Awaiting second opinion at tertiary center.   Mass of lower lobe of left lung 07/20/2020   Pancytopenia ([place]) 07/19/2020   Seizure in childhood [facility])    no seizure for many years   Tobacco abuse     SURGICAL HISTORY: Past Surgical History:  Procedure Laterality Date   KNEE SURGERY Left    for injury. Did not have knee replacement   LAPAROTOMY     done at age 37 for intestinal issues    SOCIAL HISTORY: Social History   Socioeconomic History   Marital status: Single    Spouse name: Not on file   Number of children: Not on file   Years of education: Not on file   Highest education level: Not on file  Occupational History   Not on file  Tobacco Use   Smoking status: Every Day    Packs/day: 1.00    Pack years: 0.00    Types: Cigarettes   Smokeless tobacco: Former  Scientific laboratory technician Use: Never used  Substance and Sexual Activity   Alcohol use: Not on file   Drug use: Not on file   Sexual activity: Not on file  Other Topics Concern   Not on file  Social History Narrative   Not on file   Social Determinants of Health   Financial Resource Strain: Not on file  Food Insecurity: Not on file  Transportation  Needs: Not on file  Physical Activity: Not on file  Stress: Not on file  Social Connections: Not on file  Intimate Partner Violence: Not on file    FAMILY HISTORY: Family History  Problem Relation Age of Onset   COPD Mother    Alcoholism Father    Cirrhosis Father     ALLERGIES:  has No Known Allergies.  MEDICATIONS:  Current Facility-Administered Medications  Medication Dose  Route Frequency Provider Last Rate Last Admin   0.9 %  sodium chloride infusion   Intravenous Continuous Ralene Muskrat B, MD 100 mL/hr at 08/04/20 1527 New Bag at 08/04/20 1527   acetaminophen (TYLENOL) tablet 650 mg  650 mg Oral Q6H PRN Para Skeans, MD       Or   acetaminophen (TYLENOL) suppository 650 mg  650 mg Rectal Q6H PRN Para Skeans, MD       albuterol (PROVENTIL) (2.5 MG/3ML) 0.083% nebulizer solution 3 mL  3 mL Nebulization Q6H PRN Para Skeans, MD       calcitonin (MIACALCIN) injection 252 Units  4 Units/kg Intramuscular BID Cammie Sickle, MD   252 Units at 08/04/20 1012   nicotine (NICODERM CQ - dosed in mg/24 hours) patch 21 mg  21 mg Transdermal Daily Para Skeans, MD       ondansetron (ZOFRAN) injection 4 mg  4 mg Intravenous Q6H PRN Sreenath, Sudheer B, MD       pantoprazole (PROTONIX) EC tablet 40 mg  40 mg Oral Daily Florina Ou V, MD   40 mg at 08/04/20 1002   predniSONE (DELTASONE) tablet 100 mg  100 mg Oral Q breakfast Charlaine Dalton R, MD   100 mg at 08/04/20 1003      .  PHYSICAL EXAMINATION:  Vitals:   08/04/20 1214 08/04/20 1543  BP: 105/66 99/75  Pulse: (!) 55 93  Resp: 16 16  Temp: 97.6 F (36.4 C) 98.1 F (36.7 C)  SpO2: 99% 97%   There were no vitals filed for this visit.  Physical Exam Constitutional:      Comments: Patient sitting in the chair.  Alone.  HENT:     Head: Normocephalic and atraumatic.     Mouth/Throat:     Pharynx: No oropharyngeal exudate.  Eyes:     Pupils: Pupils are equal, round, and reactive to light.  Neck:     Comments: Positive for lymphadenopathy in the neck; also bilateral axilla right more than left Cardiovascular:     Rate and Rhythm: Normal rate and regular rhythm.  Pulmonary:     Effort: No respiratory distress.     Breath sounds: No wheezing.     Comments: Decreased breath sounds bilaterally at bases.  No wheeze or crackles Abdominal:     General: Bowel sounds are normal. There is no  distension.     Palpations: Abdomen is soft. There is no mass.     Tenderness: There is no abdominal tenderness. There is no guarding or rebound.  Musculoskeletal:        General: No tenderness. Normal range of motion.     Cervical back: Normal range of motion and neck supple.  Skin:    General: Skin is warm.  Neurological:     Mental Status: He is alert and oriented to person, place, and time.  Psychiatric:        Mood and Affect: Affect normal.     LABORATORY DATA:  I have reviewed the data as listed Lab  Results  Component Value Date   WBC 1.9 (L) 08/04/2020   HGB 8.3 (L) 08/04/2020   HCT 24.2 (L) 08/04/2020   MCV 92.7 08/04/2020   PLT 132 (L) 08/04/2020   Recent Labs    07/20/20 1118 07/21/20 0626 07/25/20 0358 07/30/20 1523 07/31/20 0929 08/03/20 1143 08/03/20 1628 08/04/20 0942  NA  --    < > 133* 128*   < > 128* 130* 130*  K  --    < > 3.8 4.9   < > 4.4 4.2 3.7  CL  --    < > 101 87*   < > 90* 94* 95*  CO2  --    < > 24 31   < > 30 32 29  GLUCOSE  --    < > 84 84   < > 103* 100* 122*  BUN  --    < > 23 37*   < > 37* 36* 30*  CREATININE  --    < > 0.93 1.24   < > 1.46* 1.25* 1.17  CALCIUM  --    < > 8.8* 12.7*   < > 12.3* 11.9* 11.0*  GFRNONAA  --    < > >60 >60   < > 52* >60 >60  PROT 5.0*   < > 4.1* 5.3*  --   --  4.9*  --   ALBUMIN 3.0*   < > 2.4* 3.0*  --   --  2.9*  --   AST 70*   < > 68* 70*  --   --  77*  --   ALT 22   < > 20 38  --   --  37  --   ALKPHOS 65   < > 65 101  --   --  113  --   BILITOT 1.6*   < > 1.1 0.9  --   --  0.8  --   BILIDIR 0.5*  --   --   --   --   --   --   --   IBILI 1.1*  --   --   --   --   --   --   --    < > = values in this interval not displayed.    RADIOGRAPHIC STUDIES: I have personally reviewed the radiological images as listed and agreed with the findings in the report. CT ABDOMEN PELVIS WO CONTRAST  Result Date: 07/20/2020 CLINICAL DATA:  70 year old male with history of lymphadenopathy. EXAM: CT ABDOMEN AND  PELVIS WITHOUT CONTRAST TECHNIQUE: Multidetector CT imaging of the abdomen and pelvis was performed following the standard protocol without IV contrast. COMPARISON:  No priors. FINDINGS: Lower chest: 1.6 x 1.2 cm left lower lobe pulmonary nodule (axial image 3 of series 4). Atherosclerotic calcifications in the descending thoracic aorta as well as the left main, left anterior descending and right coronary arteries. Mild calcifications of the aortic valve. Hepatobiliary: Low-attenuation lesions are noted in the liver, incompletely characterized on today's non-contrast CT examination, but statistically likely to represent cysts, largest of which is in segment 4A (axial image 19 of series 2) measuring 4.0 x 2.6 cm. Amorphous intermediate attenuation material lying dependently in the gallbladder, likely to represent biliary sludge. Gallbladder is nearly decompressed. Pancreas: No definite pancreatic mass or peripancreatic fluid collections or inflammatory changes are noted on today's noncontrast CT examination. Spleen: Spleen is enlarged measuring 15.3 x 6.4 x 13.8 cm (estimated splenic volume of 665 mL) .  Adrenals/Urinary Tract: 3 mm nonobstructive calculus in the interpolar collecting system of the left kidney. No additional calculi are noted within the right renal collecting system, along the course of either ureter, or within the lumen of the urinary bladder. No hydroureteronephrosis. Unenhanced appearance of the kidneys is otherwise unremarkable. Bilateral adrenal glands are normal in appearance. Urinary bladder is normal in appearance. Stomach/Bowel: Unenhanced appearance of the stomach is normal. No pathologic dilatation of small bowel or colon. The appendix is not confidently identified and may be surgically absent. Regardless, there are no inflammatory changes noted adjacent to the cecum to suggest the presence of an acute appendicitis at this time. Vascular/Lymphatic: Aortic atherosclerosis. Multiple enlarged  retroperitoneal lymph nodes, largest of which is in the left para-aortic nodal station adjacent to the left renal hilum (axial image 30 of series 3) measuring 1.8 x 1.5 cm. Reproductive: Prostate gland and seminal vesicles are unremarkable in appearance. Other: No significant volume of ascites.  No pneumoperitoneum. Musculoskeletal: There are no aggressive appearing lytic or blastic lesions noted in the visualized portions of the skeleton. Chronic appearing compression fracture of T12 with 25% loss of anterior vertebral body height. IMPRESSION: 1. Extensive retroperitoneal lymphadenopathy. There is also splenomegaly. Clinical correlation for signs and symptoms of lymphoproliferative disorder is recommended. 2. 1.6 x 1.2 cm left lower lobe pulmonary nodule concerning for neoplasm. Consider one of the following in 3 months for both low-risk and high-risk individuals: (a) repeat chest CT or (b) follow-up PET-CT. This recommendation follows the consensus statement: Guidelines for Management of Incidental Pulmonary Nodules Detected on CT Images: From the Fleischner Society 2017; Radiology 2017; 284:228-243. 3. Probable biliary sludge lying dependently in the gallbladder. 4. Aortic atherosclerosis. 5. Additional incidental findings, as above. Electronically Signed   By: Vinnie Langton M.D.   On: 07/20/2020 15:23   CT Chest Wo Contrast  Result Date: 07/19/2020 CLINICAL DATA:  Dyspnea. Interstitial lung disease suspected. Altered mental status over the last couple of days. EXAM: CT CHEST WITHOUT CONTRAST TECHNIQUE: Multidetector CT imaging of the chest was performed following the standard protocol without IV contrast. COMPARISON:  Current and prior chest radiographs. FINDINGS: Cardiovascular: Heart normal in size. Coronary artery calcifications. Aortic atherosclerosis. Mediastinum/Nodes: Enlarged right axillary lymph nodes, largest measuring 1.3 cm in short axis. There are shoddy subcentimeter neck base lymph nodes.  There are several prominent to mildly enlarged mediastinal lymph nodes. Azygos level right paratracheal node measures 1.3 cm in short axis. Subcarinal node measures 1.8 cm in short axis. Probable mildly enlarged inferior right hilar lymph node, 1.3 cm in short axis. Trachea and esophagus are unremarkable. Lungs/Pleura: Lungs demonstrate interstitial thickening peripheral cystic spaces, with a predominance in the mid to upper lungs. There is an ill-defined 1.1 cm nodular opacity in the left lower lobe, image 91, series 3. No convincing pneumonia. No pulmonary edema. No pleural effusion or pneumothorax. Upper Abdomen: Enlarged spleen, 15 cm in greatest transverse dimension. Prominent to mildly enlarged lymph nodes in the upper abdomen, in the visualized retroperitoneum and along the Peri celiac chain. Low-density liver lesions consistent with cysts. Musculoskeletal: Skeletal structures are demineralized. There multiple vertebral compression fractures, T4, T8, T9 as well as L1, which appear chronic. No bone lesions. IMPRESSION: 1. Interstitial lung disease reflected by peripheral cystic change, interstitial thickening, areas of honeycombing and architectural distortion, with a mid to upper lung predominance. 2. 1.1 cm ill-defined nodular opacity in the left lower lobe. This could be inflammatory. Neoplastic disease is possible. Consider one of the following in  3 months for both low-risk and high-risk individuals: (a) repeat chest CT, (b) follow-up PET-CT, or (c) tissue sampling. This recommendation follows the consensus statement: Guidelines for Management of Incidental Pulmonary Nodules Detected on CT Images: From the Fleischner Society 2017; Radiology 2017; 284:228-243. No other findings in the lungs to suggest active inflammation or infection. No pulmonary edema. 3. Enlarged lymph nodes, most apparent in the right axilla, milder adenopathy along the mediastinum and right hilum, with adenopathy also noted in the  visualized upper abdomen. There is also splenomegaly. The combination of findings is concerning for a lymphoproliferative disorder including lymphoma. 4. Coronary artery calcifications and aortic atherosclerosis. Aortic Atherosclerosis (ICD10-I70.0). Electronically Signed   By: Lajean Manes M.D.   On: 07/19/2020 21:07   DG Chest Portable 1 View  Result Date: 07/19/2020 CLINICAL DATA:  Weakness. EXAM: PORTABLE CHEST 1 VIEW COMPARISON:  02/10/2011. FINDINGS: Cardiac silhouette is normal in size. Normal mediastinal and hilar contours. Lungs show bilateral interstitial thickening similar to the prior exam allowing for differences in radiographic technique. No lung consolidation. No pleural effusion or pneumothorax. Skeletal structures are grossly intact. IMPRESSION: 1. No acute cardiopulmonary disease. 2. Chronic bilateral interstitial thickening. Electronically Signed   By: Lajean Manes M.D.   On: 07/19/2020 19:56   [place] CORE BIOPSY (LYMPH NODES)  Result Date: 07/21/2020 INDICATION: 70 year old male presenting with weight loss, left lower lobe mass, and lymphadenopathy. EXAM: ULTRASOUND GUIDED core BIOPSY OF right axillary lymph node. MEDICATIONS: None. ANESTHESIA/SEDATION: Fentanyl 25 mcg IV; Versed 0.5 mg IV Moderate Sedation Time:  7 The patient was continuously monitored during the procedure by the interventional radiology nurse under my direct supervision. PROCEDURE: The procedure, risks, benefits, and alternatives were explained to the patient. Questions regarding the procedure were encouraged and answered. The patient understands and consents to the procedure. The right axilla was prepped with chlorhexidine in a sterile fashion, and a sterile drape was applied covering the operative field. A sterile gown and sterile gloves were used for the procedure. Preprocedure ultrasound evaluation demonstrated multiple prominent right axillary lymph nodes. The dominant node was selected for biopsy. The procedure was  planned. Local anesthesia was provided with 1% Lidocaine. A small skin nick was made. Under direct ultrasound visualization, a total of 3, 18 gauge core biopsies were obtained. The samples were placed on saline soaked Telfa and sent to Pathology. Postprocedural imaging demonstrated no evidence of surrounding hematoma. Sterile bandage was placed. The patient tolerated procedure well was transferred back to floor in stable condition. COMPLICATIONS: None immediate. FINDINGS: Right axillary lymphadenopathy. IMPRESSION: Technically successful ultrasound-guided right axillary lymph node biopsy. 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  Start IV fluids; proceed with bisphosphonate/Zometa; add calcitonin.  Add steroids to see below.   #Lymph node biopsy [right underarm core biopsy]-positive for lymphoma.  Discussed with Dr.Kraynie-unable to classify further.  Start prednisone 100 mg a day for 5 days.  # Pancytopenia/splenomegaly/generalized lymphadenopathy--again concerning for lymphoma.  Await above work-up.   #Interstitial lung disease based on imaging / lung nodule ~1.1cm LLL/active smoker. Out pt follow up.  #Prognosis: Patient unfortunately has limited insight into his disease status.  However he is adamant of not wanting to stay to complete work-up/start treatment. Patient  is currently ambivalent off any treatment also.  If patient is adamant on going home tomorrow, would recommend evaluation for home hospice.  Discussed with Dr.Sreenath; and also with Josh Borders.   Thank you Dr. Priscella Mann for allowing me to participate in the  care of your pleasant patient. Please do not hesitate to contact me with questions or concerns in the interim.  All questions were answered. The patient knows to call the clinic with any problems, questions or concerns.    Cammie Sickle, MD 08/04/2020 10:06 PM

## 2020-08-04 NOTE — Plan of Care (Signed)
  Problem: Education: Goal: Knowledge of General Education information will improve Description: Including pain rating scale, medication(s)/side effects and non-pharmacologic comfort measures Outcome: Progressing   Problem: Activity: Goal: Risk for activity intolerance will decrease Outcome: Progressing   Problem: Elimination: Goal: Will not experience complications related to bowel motility Outcome: Progressing Goal: Will not experience complications related to urinary retention Outcome: Progressing   Problem: Safety: Goal: Ability to remain free from injury will improve Outcome: Progressing   Problem: Skin Integrity: Goal: Risk for impaired skin integrity will decrease Outcome: Progressing

## 2020-08-05 ENCOUNTER — Inpatient Hospital Stay
Admit: 2020-08-05 | Discharge: 2020-08-05 | Disposition: A | Discharge status: Home / Self Care | Payer: Medicare Other | Attending: Internal Medicine | Admitting: Internal Medicine

## 2020-08-05 ENCOUNTER — Telehealth: Payer: Self-pay | Admitting: *Deleted

## 2020-08-05 LAB — CBC WITH DIFFERENTIAL/PLATELET
Abs Immature Granulocytes: 0.13 10*3/uL — ABNORMAL HIGH (ref 0.00–0.07)
Basophils Absolute: 0 10*3/uL (ref 0.0–0.1)
Basophils Relative: 0 %
Eosinophils Absolute: 0 10*3/uL (ref 0.0–0.5)
Eosinophils Relative: 0 %
HCT: 21 % — ABNORMAL LOW (ref 39.0–52.0)
Hemoglobin: 7.1 g/dL — ABNORMAL LOW (ref 13.0–17.0)
Immature Granulocytes: 6 %
Lymphocytes Relative: 11 %
Lymphs Abs: 0.3 10*3/uL — ABNORMAL LOW (ref 0.7–4.0)
MCH: 31.1 pg (ref 26.0–34.0)
MCHC: 33.8 g/dL (ref 30.0–36.0)
MCV: 92.1 fL (ref 80.0–100.0)
Monocytes Absolute: 0.2 10*3/uL (ref 0.1–1.0)
Monocytes Relative: 8 %
Neutro Abs: 1.7 10*3/uL (ref 1.7–7.7)
Neutrophils Relative %: 75 %
Platelets: 127 10*3/uL — ABNORMAL LOW (ref 150–400)
RBC: 2.28 MIL/uL — ABNORMAL LOW (ref 4.22–5.81)
RDW: 20.3 % — ABNORMAL HIGH (ref 11.5–15.5)
Smear Review: NORMAL
WBC: 2.2 10*3/uL — ABNORMAL LOW (ref 4.0–10.5)
nRBC: 0 % (ref 0.0–0.2)

## 2020-08-05 LAB — ECHOCARDIOGRAM COMPLETE
AR max vel: 1.89 cm2
AV Area VTI: 1.7 cm2
AV Area mean vel: 1.94 cm2
AV Mean grad: 5 mmHg
AV Peak grad: 11.7 mmHg
Ao pk vel: 1.71 m/s
Area-P 1/2: 6.6 cm2
Height: 68 in
MV VTI: 2.52 cm2
S' Lateral: 2.5 cm
Weight: 2214.4 oz

## 2020-08-05 LAB — BASIC METABOLIC PANEL
Anion gap: 7 (ref 5–15)
BUN: 29 mg/dL — ABNORMAL HIGH (ref 8–23)
CO2: 29 mmol/L (ref 22–32)
Calcium: 10.1 mg/dL (ref 8.9–10.3)
Chloride: 98 mmol/L (ref 98–111)
Creatinine, Ser: 1.1 mg/dL (ref 0.61–1.24)
GFR, Estimated: >60 mL/min (ref >60)
Glucose, Bld: 87 mg/dL (ref 70–99)
Potassium: 3.6 mmol/L (ref 3.5–5.1)
Sodium: 134 mmol/L — ABNORMAL LOW (ref 135–145)

## 2020-08-05 LAB — CALCIUM, IONIZED: Calcium, Ionized, Serum: 6.5 mg/dL — ABNORMAL HIGH (ref 4.5–5.6)

## 2020-08-05 MED ORDER — SODIUM CHLORIDE 0.9 % IV SOLN
12.5000 mg | Freq: Four times a day (QID) | INTRAVENOUS | Status: DC | PRN
  Administered 2020-08-05: 12.5 mg via INTRAVENOUS
  Filled 2020-08-05: qty 12.5
  Filled 2020-08-05: qty 0.5

## 2020-08-05 NOTE — Progress Notes (Signed)
Patrick Shepard   DOB:[birth year]   CW#:[id number]    Subjective: No acute events overnight.  Denies any nausea vomiting abdominal pain.  Objective:  Vitals:   08/05/20 1118 08/05/20 2015  BP: (!) 109/55 110/68  Pulse: 100 93  Resp: 18 16  Temp: 98.7 F (37.1 C) (!) 97.4 F (36.3 C)  SpO2: 98% 98%     Intake/Output Summary (Last 24 hours) at 08/05/2020 2143 Last data filed at 08/05/2020 2000 Gross per 24 hour  Intake 3336.26 ml  Output 3975 ml  Net -638.74 ml    Physical Exam Constitutional:      Comments: Accompanied by family.  HENT:     Head: Normocephalic and atraumatic.     Mouth/Throat:     Pharynx: No oropharyngeal exudate.  Eyes:     Pupils: Pupils are equal, round, and reactive to light.  Cardiovascular:     Rate and Rhythm: Normal rate and regular rhythm.  Pulmonary:     Effort: No respiratory distress.     Breath sounds: No wheezing.     Comments: Decreased breath sounds bilaterally at bases.  No wheeze or crackles Abdominal:     General: Bowel sounds are normal. There is no distension.     Palpations: Abdomen is soft. There is no mass.     Tenderness: There is no abdominal tenderness. There is no guarding or rebound.  Musculoskeletal:        General: No tenderness. Normal range of motion.     Cervical back: Normal range of motion and neck supple.  Skin:    General: Skin is warm.  Neurological:     Mental Status: He is alert and oriented to person, place, and time.  Psychiatric:        Mood and Affect: Affect normal.     Labs:  Lab Results  Component Value Date   WBC 2.2 (L) 08/05/2020   HGB 7.1 (L) 08/05/2020   HCT 21.0 (L) 08/05/2020   MCV 92.1 08/05/2020   PLT 127 (L) 08/05/2020   NEUTROABS 1.7 08/05/2020    Lab Results  Component Value Date   NA 134 (L) 08/05/2020   K 3.6 08/05/2020   CL 98 08/05/2020   CO2 29 08/05/2020    Studies:  ECHOCARDIOGRAM COMPLETE  Result Date: 08/05/2020    ECHOCARDIOGRAM REPORT   Patient Name:    Patrick Shepard Date of Exam: 08/05/2020 Medical Rec #:  [id number]          Height:       68.0 in Accession #:    [id number]         Weight:       138.4 lb Date of Birth:  [birth year]          BSA:          1.748 m Patient Age:    70 years           BP:           93/65 mmHg Patient Gender: M                  HR:           96 bpm. Exam Location:  [facility] Procedure: 2D Echo, Color Doppler, Cardiac Doppler and Strain Analysis Indications:     Z09 Chemo  History:         Patient has no prior history of Echocardiogram examinations.  Emphysema; Risk Factors:Current Smoker.  Sonographer:     Charmayne Sheer RDCS (AE) Referring Phys:  [id number] Cammie Sickle Diagnosing Phys: Bartholome Bill MD  Sonographer Comments: Global longitudinal strain was attempted. IMPRESSIONS  1. Left ventricular ejection fraction, by estimation, is 70 to 75%. The left ventricle has hyperdynamic function. The left ventricle has no regional wall motion abnormalities. Left ventricular diastolic parameters are consistent with Grade I diastolic dysfunction (impaired relaxation).  2. Right ventricular systolic function is normal. The right ventricular size is mildly enlarged.  3. Moderate pleural effusion in the left lateral region.  4. The mitral valve is grossly normal. Trivial mitral valve regurgitation.  5. The aortic valve was not well visualized. Aortic valve regurgitation is mild. FINDINGS  Left Ventricle: Left ventricular ejection fraction, by estimation, is 70 to 75%. The left ventricle has hyperdynamic function. The left ventricle has no regional wall motion abnormalities. The left ventricular internal cavity size was normal in size. There is no left ventricular hypertrophy. Left ventricular diastolic parameters are consistent with Grade I diastolic dysfunction (impaired relaxation). Right Ventricle: The right ventricular size is mildly enlarged. No increase in right ventricular wall thickness. Right ventricular systolic function  is normal. Left Atrium: Left atrial size was normal in size. Right Atrium: Right atrial size was normal in size. Pericardium: There is no evidence of pericardial effusion. Mitral Valve: The mitral valve is grossly normal. Trivial mitral valve regurgitation. MV peak gradient, 8.1 mmHg. The mean mitral valve gradient is 3.0 mmHg. Tricuspid Valve: The tricuspid valve is not well visualized. Tricuspid valve regurgitation is mild. Aortic Valve: The aortic valve was not well visualized. Aortic valve regurgitation is mild. Aortic valve mean gradient measures 5.0 mmHg. Aortic valve peak gradient measures 11.7 mmHg. Aortic valve area, by VTI measures 1.70 cm. Pulmonic Valve: The pulmonic valve was not well visualized. Pulmonic valve regurgitation is trivial. Aorta: The aortic root is normal in size and structure. IAS/Shunts: The interatrial septum was not assessed. Additional Comments: There is a moderate pleural effusion in the left lateral region.  LEFT VENTRICLE PLAX 2D LVIDd:         4.10 cm  Diastology LVIDs:         2.50 cm  LV e' medial:    8.05 cm/s LV PW:         0.90 cm  LV E/e' medial:  8.9 LV IVS:        0.70 cm  LV e' lateral:   11.30 cm/s LVOT diam:     1.90 cm  LV E/e' lateral: 6.4 LV SV:         52 LV SV Index:   30 LVOT Area:     2.84 cm  RIGHT VENTRICLE RV Basal diam:  2.60 cm LEFT ATRIUM             Index       RIGHT ATRIUM           Index LA diam:        3.00 cm 1.72 cm/m  RA Area:     16.80 cm LA Vol (A2C):   31.0 ml 17.74 ml/m RA Volume:   47.00 ml  26.89 ml/m LA Vol (A4C):   36.7 ml 21.00 ml/m LA Biplane Vol: 36.5 ml 20.88 ml/m  AORTIC VALVE                    PULMONIC VALVE AV Area (Vmax):    1.89 cm  PV Vmax:       1.09 m/s AV Area (Vmean):   1.94 cm     PV Vmean:      85.500 cm/s AV Area (VTI):     1.70 cm     PV VTI:        0.195 m AV Vmax:           171.00 cm/s  PV Peak grad:  4.8 mmHg AV Vmean:          107.000 cm/s PV Mean grad:  3.0 mmHg AV VTI:            0.307 m AV Peak Grad:       11.7 mmHg AV Mean Grad:      5.0 mmHg LVOT Vmax:         114.00 cm/s LVOT Vmean:        73.400 cm/s LVOT VTI:          0.184 m LVOT/AV VTI ratio: 0.60  AORTA Ao Root diam: 3.40 cm MITRAL VALVE MV Area (PHT): 6.60 cm     SHUNTS MV Area VTI:   2.52 cm     Systemic VTI:  0.18 m MV Peak grad:  8.1 mmHg     Systemic Diam: 1.90 cm MV Mean grad:  3.0 mmHg MV Vmax:       1.42 m/s MV Vmean:      84.3 cm/s MV Decel Time: 115 msec MV E velocity: 72.00 cm/s MV A velocity: 103.00 cm/s MV E/A ratio:  0.70 Bartholome Bill MD Electronically signed by Bartholome Bill MD Signature Date/Time: 08/05/2020/12:26:13 PM    Final     Hypercalcemia #70 year old male patient 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  Continue calcitonin for now.  See discussion below  #Lymph node biopsy [right underarm core biopsy]-positive for lymphoma.  Discussed with Dr.Kraynie and Also hematopathologist at [facility] pending; hopefully preliminary results tomorrow.  Continue prednisone 100 mg a day for 5 days.  See discussion below  # Pancytopenia/splenomegaly/generalized lymphadenopathy--again concerning for lymphoma.  See discussion below  #Prognosis/disposition: I had a long discussion with the patient and Tammy ["stepdaughter"]-given the overall poor prognosis given hypercalcemia/in general patient's reluctance with chemotherapy/treatment options.  Tammy was concerned about patient's poor tolerance of therapy-which is very important as patient is significantly high risk of complications from chemotherapy.  As per Tammy-patient has poor social support.  In view of complicated medical/social situation I would recommend hospice.  I will tried to reach patient's Sister [name] twice.  Discussed with [employer].  Discussed with Dr.Sreenath.   Addendum: As per the  discussion with Josh Borders/patient and Truddie Crumble was confirmed, which I think is the best course of action given patient's clinical situation.  # 40 minutes face-to-face with the patient/family discussing the above plan of care; more than 50% of time spent on prognosis/ natural history; counseling and coordination.  Cammie Sickle, MD 08/05/2020  9:43 PM

## 2020-08-05 NOTE — Progress Notes (Signed)
*  PRELIMINARY RESULTS* Echocardiogram 2D Echocardiogram has been performed.  Patrick Shepard 08/05/2020, 11:22 AM

## 2020-08-05 NOTE — Progress Notes (Signed)
Extensive time spent with patient and sister bedside. Consultation with Josh ( Palliative Care ) to discuss goals of care going forward.

## 2020-08-05 NOTE — Progress Notes (Signed)
[place]  Telephone:([phone] [phone]   Name: Patrick Shepard Date: 08/05/2020 MRN: [id number]  DOB: Oct 04, 1950  Patient Care Team: Mechele Claude, FNP as PCP - General (Family Medicine)    REASON FOR CONSULTATION: Patrick Shepard is a 70 y.o. male who was hospitalized 07/19/2020 to 07/25/2020 with hypercalcemia.  Patient had progressive weakness and weight loss over the past 6 months.  CT of the chest showed a left lower lobe opacity with enlarged axillary and mediastinal lymphadenopathy.  He underwent ultrasound-guided right axillary lymph node biopsy on 5/31, with preliminary report suggestive of lymphoma.  Patient was readmitted on 08/03/2020 with recurrent hypercalcemia and hypotension.  Palliative care was consulted help address goals.    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 Lymph node biopsy on 07/21/20. Oncology noted: Lymph node biopsy [right underarm core biopsy]-positive for lymphoma.  Discussed with Dr.Kraynie.  However unfortunately unable to subclassify further at this time.  Awaiting second opinion at tertiary center.   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 Did not have knee replacement   LAPAROTOMY     done at age 65 for intestinal issues    HEMATOLOGY/ONCOLOGY HISTORY:  Oncology History   No history exists.    ALLERGIES:  has No Known Allergies.  MEDICATIONS:  Current Facility-Administered Medications  Medication Dose Route Frequency Provider Last Rate Last Admin   0.9 %  sodium chloride infusion   Intravenous Continuous Ralene Muskrat B, MD 100 mL/hr at 08/05/20 1541 New Bag at 08/05/20 1541   acetaminophen (TYLENOL) tablet 650 mg  650 mg Oral Q6H PRN Para Skeans, MD       Or   acetaminophen (TYLENOL) suppository 650 mg  650 mg Rectal Q6H PRN Para Skeans, MD       albuterol (PROVENTIL) (2.5 MG/3ML) 0.083% nebulizer solution 3 mL  3 mL Nebulization Q6H PRN Para Skeans, MD       calcitonin (MIACALCIN) injection 252 Units  4 Units/kg Intramuscular BID Cammie Sickle, MD   252 Units at 08/05/20 0272   nicotine (NICODERM CQ - dosed in mg/24 hours) patch 21 mg  21 mg Transdermal Daily Para Skeans, MD       ondansetron [facility]) injection 4 mg  4 mg Intravenous Q6H PRN Ralene Muskrat B, MD   4 mg at 08/05/20 0542   pantoprazole (PROTONIX) EC tablet 40 mg  40 mg Oral Daily Florina Ou V, MD   40 mg at 08/05/20 0809   predniSONE (DELTASONE) tablet 100 mg  100 mg Oral Q breakfast Charlaine Dalton R, MD   100 mg at 08/05/20 0809   promethazine (PHENERGAN) 12.5 mg in sodium chloride 0.9 % 50 mL IVPB  12.5 mg Intravenous Q6H PRN Ralene Muskrat B, MD   Stopped at 08/05/20 1132    VITAL SIGNS: BP (!) 109/55 (BP Location: Right Arm)   Pulse 100   Temp 98.7 F (37.1 C) (Oral)   Resp 18   Ht $R'5\' 8"'MB$  (1.727 m)  SpO2 98%   BMI 21.04 kg/m  There were no vitals filed for this visit.  Estimated body mass index is 21.04 kg/m as calculated from the following:   Height as of this encounter: $RemoveBeforeD'5\' 8"'BZTWbfflyVLFYF$  (1.727 m).   Weight as of an earlier encounter on 08/03/20: 138 lb 6.4 oz (62.8 kg).  LABS: CBC:    Component Value Date/Time   WBC 2.2 (L) 08/05/2020 0615   HGB 7.1 (L) 08/05/2020 0615   HCT 21.0 (L) 08/05/2020 0615   PLT 127 (L) 08/05/2020 0615   MCV 92.1 08/05/2020 0615   NEUTROABS 1.7 08/05/2020 0615   LYMPHSABS 0.3 (L) 08/05/2020 0615   MONOABS 0.2 08/05/2020 0615   EOSABS 0.0 08/05/2020 0615   BASOSABS 0.0 08/05/2020 0615   Comprehensive Metabolic Panel:    Component Value Date/Time   NA 134 (L)  08/05/2020 0615   K 3.6 08/05/2020 0615   CL 98 08/05/2020 0615   CO2 29 08/05/2020 0615   BUN 29 (H) 08/05/2020 0615   CREATININE 1.10 08/05/2020 0615   GLUCOSE 87 08/05/2020 0615   CALCIUM 10.1 08/05/2020 0615   CALCIUM 11.1 (H) 07/20/2020 0607   AST 77 (H) 08/03/2020 1628   ALT 37 08/03/2020 1628   ALKPHOS 113 08/03/2020 1628   BILITOT 0.8 08/03/2020 1628   PROT 4.9 (L) 08/03/2020 1628   ALBUMIN 2.9 (L) 08/03/2020 1628    RADIOGRAPHIC STUDIES: CT ABDOMEN PELVIS WO CONTRAST  Result Date: 07/20/2020 CLINICAL DATA:  70 year old male with history of lymphadenopathy. EXAM: CT ABDOMEN AND PELVIS WITHOUT CONTRAST TECHNIQUE: Multidetector CT imaging of the abdomen and pelvis was performed following the standard protocol without IV contrast. COMPARISON:  No priors. FINDINGS: Lower chest: 1.6 x 1.2 cm left lower lobe pulmonary nodule (axial image 3 of series 4). Atherosclerotic calcifications in the descending thoracic aorta as well as the left main, left anterior descending and right coronary arteries. Mild calcifications of the aortic valve. Hepatobiliary: Low-attenuation lesions are noted in the liver, incompletely characterized on today's non-contrast CT examination, but statistically likely to represent cysts, largest of which is in segment 4A (axial image 19 of series 2) measuring 4.0 x 2.6 cm. Amorphous intermediate attenuation material lying dependently in the gallbladder, likely to represent biliary sludge. Gallbladder is nearly decompressed. Pancreas: No definite pancreatic mass or peripancreatic fluid collections or inflammatory changes are noted on today's noncontrast CT examination. Spleen: Spleen is enlarged measuring 15.3 x 6.4 x 13.8 cm (estimated splenic volume of 665 mL) . Adrenals/Urinary Tract: 3 mm nonobstructive calculus in the interpolar collecting system of the left kidney. No additional calculi are noted within the right renal collecting system, along the course of either  ureter, or within the lumen of the urinary bladder. No hydroureteronephrosis. Unenhanced appearance of the kidneys is otherwise unremarkable. Bilateral adrenal glands are normal in appearance. Urinary bladder is normal in appearance. Stomach/Bowel: Unenhanced appearance of the stomach is normal. No pathologic dilatation of small bowel or colon. The appendix is not confidently identified and may be surgically absent. Regardless, there are no inflammatory changes noted adjacent to the cecum to suggest the presence of an acute appendicitis at this time. Vascular/Lymphatic: Aortic atherosclerosis. Multiple enlarged retroperitoneal lymph nodes, largest of which is in the left para-aortic nodal station adjacent to the left renal hilum (axial image 30 of series 3) measuring 1.8 x 1.5 cm. Reproductive: Prostate gland and seminal vesicles are unremarkable in appearance. Other: No significant volume of ascites.  No pneumoperitoneum. Musculoskeletal: There are no aggressive  appearing lytic or blastic lesions noted in the visualized portions of the skeleton. Chronic appearing compression fracture of T12 with 25% loss of anterior vertebral body height. IMPRESSION: 1. Extensive retroperitoneal lymphadenopathy. There is also splenomegaly. Clinical correlation for signs and symptoms of lymphoproliferative disorder is recommended. 2. 1.6 x 1.2 cm left lower lobe pulmonary nodule concerning for neoplasm. Consider one of the following in 3 months for both low-risk and high-risk individuals: (a) repeat chest CT or (b) follow-up PET-CT. This recommendation follows the consensus statement: Guidelines for Management of Incidental Pulmonary Nodules Detected on CT Images: From the Fleischner Society 2017; Radiology 2017; 284:228-243. 3. Probable biliary sludge lying dependently in the gallbladder. 4. Aortic atherosclerosis. 5. Additional incidental findings, as above. Electronically Signed   By: Vinnie Langton M.D.   On: 07/20/2020 15:23    CT Chest Wo Contrast  Result Date: 07/19/2020 CLINICAL DATA:  Dyspnea. Interstitial lung disease suspected. Altered mental status over the last couple of days. EXAM: CT CHEST WITHOUT CONTRAST TECHNIQUE: Multidetector CT imaging of the chest was performed following the standard protocol without IV contrast. COMPARISON:  Current and prior chest radiographs. FINDINGS: Cardiovascular: Heart normal in size. Coronary artery calcifications. Aortic atherosclerosis. Mediastinum/Nodes: Enlarged right axillary lymph nodes, largest measuring 1.3 cm in short axis. There are shoddy subcentimeter neck base lymph nodes. There are several prominent to mildly enlarged mediastinal lymph nodes. Azygos level right paratracheal node measures 1.3 cm in short axis. Subcarinal node measures 1.8 cm in short axis. Probable mildly enlarged inferior right hilar lymph node, 1.3 cm in short axis. Trachea and esophagus are unremarkable. Lungs/Pleura: Lungs demonstrate interstitial thickening peripheral cystic spaces, with a predominance in the mid to upper lungs. There is an ill-defined 1.1 cm nodular opacity in the left lower lobe, image 91, series 3. No convincing pneumonia. No pulmonary edema. No pleural effusion or pneumothorax. Upper Abdomen: Enlarged spleen, 15 cm in greatest transverse dimension. Prominent to mildly enlarged lymph nodes in the upper abdomen, in the visualized retroperitoneum and along the Peri celiac chain. Low-density liver lesions consistent with cysts. Musculoskeletal: Skeletal structures are demineralized. There multiple vertebral compression fractures, T4, T8, T9 as well as L1, which appear chronic. No bone lesions. IMPRESSION: 1. Interstitial lung disease reflected by peripheral cystic change, interstitial thickening, areas of honeycombing and architectural distortion, with a mid to upper lung predominance. 2. 1.1 cm ill-defined nodular opacity in the left lower lobe. This could be inflammatory. Neoplastic  disease is possible. Consider one of the following in 3 months for both low-risk and high-risk individuals: (a) repeat chest CT, (b) follow-up PET-CT, or (c) tissue sampling. This recommendation follows the consensus statement: Guidelines for Management of Incidental Pulmonary Nodules Detected on CT Images: From the Fleischner Society 2017; Radiology 2017; 284:228-243. No other findings in the lungs to suggest active inflammation or infection. No pulmonary edema. 3. Enlarged lymph nodes, most apparent in the right axilla, milder adenopathy along the mediastinum and right hilum, with adenopathy also noted in the visualized upper abdomen. There is also splenomegaly. The combination of findings is concerning for a lymphoproliferative disorder including lymphoma. 4. Coronary artery calcifications and aortic atherosclerosis. Aortic Atherosclerosis (ICD10-I70.0). Electronically Signed   By: Lajean Manes M.D.   On: 07/19/2020 21:07   DG Chest Portable 1 View  Result Date: 07/19/2020 CLINICAL DATA:  Weakness. EXAM: PORTABLE CHEST 1 VIEW COMPARISON:  02/10/2011. FINDINGS: Cardiac silhouette is normal in size. Normal mediastinal and hilar contours. Lungs show bilateral interstitial thickening similar to the prior exam  allowing for differences in radiographic technique. No lung consolidation. No pleural effusion or pneumothorax. Skeletal structures are grossly intact. IMPRESSION: 1. No acute cardiopulmonary disease. 2. Chronic bilateral interstitial thickening. Electronically Signed   By: Lajean Manes M.D.   On: 07/19/2020 19:56   ECHOCARDIOGRAM COMPLETE  Result Date: 08/05/2020    ECHOCARDIOGRAM REPORT   Patient Name:   Patrick Shepard Date of Exam: 08/05/2020 Medical Rec #:  [id number]          Height:       68.0 in Accession #:    [id number]         Weight:       138.4 lb Date of Birth:  Jun 05, 1950          BSA:          1.748 m Patient Age:    39 years           BP:           93/65 mmHg Patient Gender: M                   HR:           96 bpm. Exam Location:  [facility] Procedure: 2D Echo, Color Doppler, Cardiac Doppler and Strain Analysis Indications:     Z09 Chemo  History:         Patient has no prior history of Echocardiogram examinations.                  Emphysema; Risk Factors:Current Smoker.  Sonographer:     Charmayne Sheer RDCS (AE) Referring Phys:  [id number] Cammie Sickle Diagnosing Phys: Bartholome Bill MD  Sonographer Comments: Global longitudinal strain was attempted. IMPRESSIONS  1. Left ventricular ejection fraction, by estimation, is 70 to 75%. The left ventricle has hyperdynamic function. The left ventricle has no regional wall motion abnormalities. Left ventricular diastolic parameters are consistent with Grade I diastolic dysfunction (impaired relaxation).  2. Right ventricular systolic function is normal. The right ventricular size is mildly enlarged.  3. Moderate pleural effusion in the left lateral region.  4. The mitral valve is grossly normal. Trivial mitral valve regurgitation.  5. The aortic valve was not well visualized. Aortic valve regurgitation is mild. FINDINGS  Left Ventricle: Left ventricular ejection fraction, by estimation, is 70 to 75%. The left ventricle has hyperdynamic function. The left ventricle has no regional wall motion abnormalities. The left ventricular internal cavity size was normal in size. There is no left ventricular hypertrophy. Left ventricular diastolic parameters are consistent with Grade I diastolic dysfunction (impaired relaxation). Right Ventricle: The right ventricular size is mildly enlarged. No increase in right ventricular wall thickness. Right ventricular systolic function is normal. Left Atrium: Left atrial size was normal in size. Right Atrium: Right atrial size was normal in size. Pericardium: There is no evidence of pericardial effusion. Mitral Valve: The mitral valve is grossly normal. Trivial mitral valve regurgitation. MV peak gradient, 8.1 mmHg. The mean mitral  valve gradient is 3.0 mmHg. Tricuspid Valve: The tricuspid valve is not well visualized. Tricuspid valve regurgitation is mild. Aortic Valve: The aortic valve was not well visualized. Aortic valve regurgitation is mild. Aortic valve mean gradient measures 5.0 mmHg. Aortic valve peak gradient measures 11.7 mmHg. Aortic valve area, by VTI measures 1.70 cm. Pulmonic Valve: The pulmonic valve was not well visualized. Pulmonic valve regurgitation is trivial. Aorta: The aortic root is normal in size and structure. IAS/Shunts: The interatrial septum was not  assessed. Additional Comments: There is a moderate pleural effusion in the left lateral region.  LEFT VENTRICLE PLAX 2D LVIDd:         4.10 cm  Diastology LVIDs:         2.50 cm  LV e' medial:    8.05 cm/s LV PW:         0.90 cm  LV E/e' medial:  8.9 LV IVS:        0.70 cm  LV e' lateral:   11.30 cm/s LVOT diam:     1.90 cm  LV E/e' lateral: 6.4 LV SV:         52 LV SV Index:   30 LVOT Area:     2.84 cm  RIGHT VENTRICLE RV Basal diam:  2.60 cm LEFT ATRIUM             Index       RIGHT ATRIUM           Index LA diam:        3.00 cm 1.72 cm/m  RA Area:     16.80 cm LA Vol (A2C):   31.0 ml 17.74 ml/m RA Volume:   47.00 ml  26.89 ml/m LA Vol (A4C):   36.7 ml 21.00 ml/m LA Biplane Vol: 36.5 ml 20.88 ml/m  AORTIC VALVE                    PULMONIC VALVE AV Area (Vmax):    1.89 cm     PV Vmax:       1.09 m/s AV Area (Vmean):   1.94 cm     PV Vmean:      85.500 cm/s AV Area (VTI):     1.70 cm     PV VTI:        0.195 m AV Vmax:           171.00 cm/s  PV Peak grad:  4.8 mmHg AV Vmean:          107.000 cm/s PV Mean grad:  3.0 mmHg AV VTI:            0.307 m AV Peak Grad:      11.7 mmHg AV Mean Grad:      5.0 mmHg LVOT Vmax:         114.00 cm/s LVOT Vmean:        73.400 cm/s LVOT VTI:          0.184 m LVOT/AV VTI ratio: 0.60  AORTA Ao Root diam: 3.40 cm MITRAL VALVE MV Area (PHT): 6.60 cm     SHUNTS MV Area VTI:   2.52 cm     Systemic VTI:  0.18 m MV Peak grad:  8.1  mmHg     Systemic Diam: 1.90 cm MV Mean grad:  3.0 mmHg MV Vmax:       1.42 m/s MV Vmean:      84.3 cm/s MV Decel Time: 115 msec MV E velocity: 72.00 cm/s MV A velocity: 103.00 cm/s MV E/A ratio:  0.70 Bartholome Bill MD Electronically signed by Bartholome Bill MD Signature Date/Time: 08/05/2020/12:26:13 PM    Final    [place] CORE BIOPSY (LYMPH NODES)  Result Date: 07/21/2020 INDICATION: 70 year old male presenting with weight loss, left lower lobe mass, and lymphadenopathy. EXAM: ULTRASOUND GUIDED core BIOPSY OF right axillary lymph node. MEDICATIONS: None. ANESTHESIA/SEDATION: Fentanyl 25 mcg IV; Versed 0.5 mg IV Moderate Sedation Time:  7 The patient was continuously monitored during  the procedure by the interventional radiology nurse under my direct supervision. PROCEDURE: The procedure, risks, benefits, and alternatives were explained to the patient. Questions regarding the procedure were encouraged and answered. The patient understands and consents to the procedure. The right axilla was prepped with chlorhexidine in a sterile fashion, and a sterile drape was applied covering the operative field. A sterile gown and sterile gloves were used for the procedure. Preprocedure ultrasound evaluation demonstrated multiple prominent right axillary lymph nodes. The dominant node was selected for biopsy. The procedure was planned. Local anesthesia was provided with 1% Lidocaine. A small skin nick was made. Under direct ultrasound visualization, a total of 3, 18 gauge core biopsies were obtained. The samples were placed on saline soaked Telfa and sent to Pathology. Postprocedural imaging demonstrated no evidence of surrounding hematoma. Sterile bandage was placed. The patient tolerated procedure well was transferred back to floor in stable condition. COMPLICATIONS: None immediate. FINDINGS: Right axillary lymphadenopathy. IMPRESSION: Technically successful ultrasound-guided right axillary lymph node biopsy. Ruthann Cancer, MD  [facility] Electronically Signed   By: Ruthann Cancer MD   On: 07/21/2020 10:49    PERFORMANCE STATUS (ECOG) : 3 - Symptomatic, >50% confined to bed  Review of Systems Unless otherwise noted, a complete review of systems is negative.  Physical Exam General: NAD Cardiovascular: regular rate and rhythm Pulmonary: clear ant fields Abdomen: soft, nontender, + bowel sounds GU: no suprapubic tenderness Extremities: no edema, no joint deformities Skin: no rashes Neurological: Weakness but otherwise nonfocal  IMPRESSION: Routine follow-up visit.  Calcium is down trended on IV fluids, calcitonin, and Zometa.  I met with privately with patient's sister and chaplain.  I then met with patient to discuss goals.  Patient remains adamant that he is not interested in pursuing treatment.  He says that we all have to die and that he would not want to prolong life and instead would want to focus on comfort.  Today, his sister also verbalized a desire to forego cancer treatment and instead focus on patient's comfort.  We discussed the option of hospice in detail.  Sister would prefer for patient to transfer to residential hospice facility.  However, patient would want to return home with hospice.  They plan to think and talk more about his disposition overnight.  In the interim, we will consult the hospice liaison to help coordinate.  I do believe the patient would be clinically appropriate for residential hospice in light of his advanced and untreated lymphoma.  Hypercalcemia is a poor prognostic indicator and lymphoma.  Patient likely to develop recurrent hypercalcemia in the near future, which would herald a rapid decline towards end-of-life.  PLAN: -Best supportive/comfort care -Please liberalize visitation policy -Hospice liaison to help coordinate disposition -Patient family considering hospice facility versus home with hospice   Time Total:  45 minutes  Visit consisted of counseling and education dealing with the complex and emotionally intense issues of symptom management and palliative care in the setting of serious and potentially life-threatening illness.Greater than 50%  of this time was spent counseling and coordinating care related to the above assessment and plan.  Signed by: Altha Harm, PhD, NP-C

## 2020-08-05 NOTE — Telephone Encounter (Signed)
Per v/o Dr. Rogue Bussing, RN contacted [facility] pathology (phone # [phone] or [phone]). Spoke with labtech. Specimen was submitted to the wrong dept and the block melted this block was not rcvd until 4 pm yesterday. [place] lab was notifed that a new block needs to be embedded/resubmitted to [facility]. Msg left that Dr. Rogue Bussing needs to discuss the patient's case with hem pathologist. Dr. Sharmaine Base Phone # given lab tech

## 2020-08-05 NOTE — Progress Notes (Signed)
Was read PROGRESS NOTE    Patrick Shepard  ZOX:[id number] DOB: [birth year] DOA: 08/03/2020 PCP: Mechele Claude, FNP   Brief Narrative:  70 y.o. male seen in ed with complaints of abnormal labs including elevated calcium and generalized weakness.  Patient required admission for hypercalcemia 07/19/2020 through 07/25/2020 with admission calcium of 13.8 for which he received IV fluids, calcitonin, and zoledronic acid on 07/20/2020.  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  Patient went to his oncology appointment on 08/03/2020, the day of admission, and was found to have recurrence of hypercalcemia along with mild hypotension, so he was sent to the emergency department.  Patient reports he does have generalized weakness that has been present for days, is located all over, is constant, and is characterized as a fatigue.  Weakness is alleviated by nothing and exacerbated by exertion.  Patient denies focal weakness, headache, chest pain, palpitations, shortness of breath, cough, fevers, abdominal pain, vomiting, or bloody stool.  Patient did meet with palliative care as an outpatient on the date of admission and signed a DNR for CODE STATUS.  Patient was seen in consultation by oncology.  Initially the plan was to send the patient home potentially with hospice care as he using any kind of treatment however after repeated discussion the patient has agreed to stay.  Palliative care and oncology both following.  Patient on calcitonin and intravenous fluids. Calcium downtrending.  Patient remains nauseous with poor p.o. intake.   Assessment & Plan:   Principal Problem:   Hypercalcemia Active Problems:   Emphysema lung (HCC)   Pancytopenia (HCC)   Mass of lower lobe of left lung   Hypotension due to hypovolemia   Hypomagnesemia   Nicotine  dependence, cigarettes, uncomplicated   Palliative care encounter  Hypercalcemia Recent admission for the same.  Suspected secondary to underlying malignancy.  Hypercalcemia mild but symptomatic. Plan: Calcitonin x6 doses, last dose 6/16 p.m. Prednisone 100 mg daily x5 doses per oncology Status post 1 dose zoledronic acid 3 mg Continue normal saline 100 cc/h Monitor daily calcium Oncology follow-up   Emphysema/tobacco abuse Stable, nicotine patch.   Pancytopenia Stable we will follow.   Mass of left lower lobe/ lymphoma S/p biopsy:Possible diagnosis. Lymph node biopsy on 07/21/20. Oncology noted: Lymph node biopsy [right underarm core biopsy]-positive for lymphoma.  Discussed with Dr.Kraynie.   -However unfortunately unable to subclassify further at this time.   -Pathology is confirmed this is not a typical appearance of Hodgkin's lymphoma or carcinoma.  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   -Pathology currently under review by [facility].  Official read is pending. Plan: Oncology to follow-up   Hypotension Attribute to hypovolemia and dehydration. Improved   Hypomagnesemia We will replace and follow levels.    DVT prophylaxis: SCDs Code Status: DNR Family Communication: Sister [name] [phone] on 6/14 Disposition Plan: Status is: Inpatient  Remains inpatient appropriate because:Inpatient level of care appropriate due to severity of illness  Dispo: The patient is from: Home              Anticipated d/c is to: Home              Patient currently is not medically stable to d/c.   Difficult to place patient No   Possible discharge in 24 hours of calcium trending down and cleared for discharge by oncology  Level of care: Med-Surg  Consultants:  Oncology Palliative care  Procedures:  None  Antimicrobials:  None   Subjective: Patient seen and examined.  More sleepy this morning but calmer and more amenable to staying in the hospital for completion of  treatment  Objective: Vitals:   08/05/20 0014 08/05/20 0434 08/05/20 0816 08/05/20 0910  BP: 105/70 118/70  93/65  Pulse: 92 92  95  Resp: 18 16  18   Temp: 97.8 F (36.6 C) 98 F (36.7 C)  97.6 F (36.4 C)  TempSrc: Oral Oral  Oral  SpO2: 98% 97%  96%  Height:   5\' 8"  (1.727 m)     Intake/Output Summary (Last 24 hours) at 08/05/2020 1010 Last data filed at 08/05/2020 4174 Gross per 24 hour  Intake 2262.19 ml  Output 3550 ml  Net -1287.81 ml   There were no vitals filed for this visit.  Examination:  General exam: No acute distress Respiratory system: Clear to auscultation. Respiratory effort normal. Cardiovascular system: S1 & S2 heard, RRR. No JVD, murmurs, rubs, gallops or clicks. No pedal edema. Gastrointestinal system: Abdomen is nondistended, soft and nontender. No organomegaly or masses felt. Normal bowel sounds heard. Central nervous system: Alert and oriented. No focal neurological deficits. Extremities: Symmetric 5 x 5 power. Skin: No rashes, lesions or ulcers Psychiatry: Judgement and insight appear impaired. Mood & affect flattened.     Data Reviewed: I have personally reviewed following labs and imaging studies  CBC: Recent Labs  Lab 07/30/20 1523 08/03/20 1628 08/04/20 0942 08/05/20 0615  WBC 2.4* 2.3* 1.9* 2.2*  NEUTROABS 1.8 1.6* 1.4* 1.7  HGB 8.2* 8.2* 8.3* 7.1*  HCT 23.7* 24.1* 24.2* 21.0*  MCV 88.8 90.3 92.7 92.1  PLT 114* 140* 132* 081*   Basic Metabolic Panel: Recent Labs  Lab 07/30/20 1523 07/31/20 0929 08/03/20 1143 08/03/20 1628 08/03/20 1655 08/04/20 0942 08/05/20 0615  NA 128* 130* 128* 130*  --  130* 134*  K 4.9 4.3 4.4 4.2  --  3.7 3.6  CL 87* 90* 90* 94*  --  95* 98  CO2 31 29 30  32  --  29 29  GLUCOSE 84 93 103* 100*  --  122* 87  BUN 37* 33* 37* 36*  --  30* 29*  CREATININE 1.24 1.08 1.46* 1.25*  --  1.17 1.10  CALCIUM 12.7* 11.8* 12.3* 11.9*  --  11.0* 10.1  MG 1.8  --   --   --  1.6* 2.0  --   PHOS  --   --   --    --  3.7  --   --    GFR: Estimated Creatinine Clearance: 56.3 mL/min (by C-G formula based on SCr of 1.1 mg/dL). Liver Function Tests: Recent Labs  Lab 07/30/20 1523 08/03/20 1628  AST 70* 77*  ALT 38 37  ALKPHOS 101 113  BILITOT 0.9 0.8  PROT 5.3* 4.9*  ALBUMIN 3.0* 2.9*   No results for input(s): LIPASE, AMYLASE in the last 168 hours. No results for input(s): AMMONIA in the last 168 hours. Coagulation Profile: No results for input(s): INR, PROTIME in the last 168 hours. Cardiac Enzymes: No results for input(s): CKTOTAL, CKMB, CKMBINDEX, TROPONINI in the last 168 hours. BNP (last 3 results) No results for input(s): PROBNP in the last 8760 hours. HbA1C: No results for input(s): HGBA1C in the last 72 hours. CBG: No results for input(s): GLUCAP in the last 168 hours. Lipid Profile: No results for input(s): CHOL, HDL, LDLCALC,  TRIG, CHOLHDL, LDLDIRECT in the last 72 hours. Thyroid Function Tests: No results for input(s): TSH, T4TOTAL, FREET4, T3FREE, THYROIDAB in the last 72 hours. Anemia Panel: No results for input(s): VITAMINB12, FOLATE, FERRITIN, TIBC, IRON, RETICCTPCT in the last 72 hours. Sepsis Labs: No results for input(s): PROCALCITON, LATICACIDVEN in the last 168 hours.  Recent Results (from the past 240 hour(s))  SARS CORONAVIRUS 2 (TAT 6-24 HRS) Nasopharyngeal Nasopharyngeal Swab     Status: None   Collection Time: 08/03/20  4:55 PM   Specimen: Nasopharyngeal Swab  Result Value Ref Range Status   SARS Coronavirus 2 NEGATIVE NEGATIVE Final    Comment: (NOTE) SARS-CoV-2 target nucleic acids are NOT DETECTED.  The SARS-CoV-2 RNA is generally detectable in upper and lower respiratory specimens during the acute phase of infection. Negative results do not preclude SARS-CoV-2 infection, do not rule out co-infections with other pathogens, and should not be used as the sole basis for treatment or other patient management decisions. Negative results must be combined  with clinical observations, patient history, and epidemiological information. The expected result is Negative.  Fact Sheet for Patients: SugarRoll.be  Fact Sheet for Healthcare Providers: https://www.woods-mathews.com/  This test is not yet approved or cleared by the [place] FDA and  has been authorized for detection and/or diagnosis of SARS-CoV-2 by FDA under an Emergency Use Authorization (EUA). This EUA will remain  in effect (meaning this test can be used) for the duration of the COVID-19 declaration under Se ction 564(b)(1) of the Act, 21 U.S.C. section 360bbb-3(b)(1), unless the authorization is terminated or revoked sooner.  Performed at [facility], [place] [address], [place], [place] [address]          Radiology Studies: No results found.      Scheduled Meds:  calcitonin  4 Units/kg Intramuscular BID   nicotine  21 mg Transdermal Daily   pantoprazole  40 mg Oral Daily   predniSONE  100 mg Oral Q breakfast   Continuous Infusions:  sodium chloride 100 mL/hr at 08/05/20 0542     LOS: 2 days    Time spent: 25 minutes    Sidney Ace, MD [facility] Pager [phone]  If 7PM-7AM, please contact night-coverage 08/05/2020, 10:10 AM

## 2020-08-06 LAB — SURGICAL PATHOLOGY

## 2020-08-06 MED ORDER — SODIUM CHLORIDE 0.9 % IV SOLN: Freq: Once | INTRAVENOUS | Status: DC

## 2020-08-06 MED ORDER — TRAZODONE HCL 50 MG PO TABS
25.0000 mg | ORAL_TABLET | Freq: Every evening | ORAL | Status: DC | PRN
  Administered 2020-08-06 (×2): 25 mg via ORAL
  Filled 2020-08-06 (×2): qty 1

## 2020-08-06 MED ORDER — SODIUM CHLORIDE 0.9 % IV BOLUS
250.0000 mL | Freq: Once | INTRAVENOUS | Status: AC
  Administered 2020-08-06: 250 mL via INTRAVENOUS

## 2020-08-06 MED ORDER — POLYETHYLENE GLYCOL 3350 17 G PO PACK
17.0000 g | PACK | Freq: Every day | ORAL | Status: DC
  Administered 2020-08-06 – 2020-08-07 (×2): 17 g via ORAL
  Filled 2020-08-06 (×2): qty 1

## 2020-08-06 NOTE — Progress Notes (Signed)
[facility])  Referral received for hospice services and to discuss residential hospice vs home with hospice.  Met with "Patrick Shepard" and his sister [name] at the bedside. Discussed hospice services, provided support and answered questions.  Currently, Patrick Shepard wants to try to go home for a trial period. He understands he needs 24 hour care but really wants to go home if possible.  [name] is willing to stay with him around the clock and they are willing to hire personal care givers if needed.  DME discussed, he will need WC, walker and 3n1. [name] is concerned that she will not be able to have his apartment ready for him to dc home today as she has had to stay with him around the clock at the hospital. Encouraged her to go home tonight to prep the home and try to rest.  [name] will be able to transport him home tomorrow.  Please send completed DNR home with pt.  Thank you for the opportunity to participate in his care.  Venia Carbon RN, BSN, [facility] Liaison

## 2020-08-06 NOTE — Progress Notes (Signed)
Patrick Shepard   DOB:1950-03-04   WR#:[id number]    Subjective: No acute events overnight.  Denies any nausea vomiting abdominal pain.  Accompanied by sister.  Eager to go home.  Objective:  Vitals:   08/06/20 1152 08/06/20 1531  BP: 95/70 90/66  Pulse: 87 99  Resp: 17 18  Temp: 97.9 F (36.6 C) 97.7 F (36.5 C)  SpO2: 95% 98%     Intake/Output Summary (Last 24 hours) at 08/06/2020 1953 Last data filed at 08/06/2020 1926 Gross per 24 hour  Intake 4081.62 ml  Output 1655 ml  Net 2426.62 ml    Physical Exam Constitutional:      Comments: Accompanied by family.  HENT:     Head: Normocephalic and atraumatic.     Mouth/Throat:     Pharynx: No oropharyngeal exudate.  Eyes:     Pupils: Pupils are equal, round, and reactive to light.  Cardiovascular:     Rate and Rhythm: Normal rate and regular rhythm.  Pulmonary:     Effort: No respiratory distress.     Breath sounds: No wheezing.     Comments: Decreased breath sounds bilaterally at bases.  No wheeze or crackles Abdominal:     General: Bowel sounds are normal. There is no distension.     Palpations: Abdomen is soft. There is no mass.     Tenderness: There is no abdominal tenderness. There is no guarding or rebound.  Musculoskeletal:        General: No tenderness. Normal range of motion.     Cervical back: Normal range of motion and neck supple.  Skin:    General: Skin is warm.  Neurological:     Mental Status: He is alert and oriented to person, place, and time.  Psychiatric:        Mood and Affect: Affect normal.     Labs:  Lab Results  Component Value Date   WBC 2.2 (L) 08/05/2020   HGB 7.1 (L) 08/05/2020   HCT 21.0 (L) 08/05/2020   MCV 92.1 08/05/2020   PLT 127 (L) 08/05/2020   NEUTROABS 1.7 08/05/2020    Lab Results  Component Value Date   NA 134 (L) 08/05/2020   K 3.6 08/05/2020   CL 98 08/05/2020   CO2 29 08/05/2020    Studies:  ECHOCARDIOGRAM COMPLETE  Result Date: 08/05/2020     ECHOCARDIOGRAM REPORT   Patient Name:   Patrick Shepard Date of Exam: 08/05/2020 Medical Rec #:  [id number]          Height:       68.0 in Accession #:    [id number]         Weight:       138.4 lb Date of Birth:  1951/01/22          BSA:          1.748 m Patient Age:    70 years           BP:           93/65 mmHg Patient Gender: M                  HR:           96 bpm. Exam Location:  [facility] Procedure: 2D Echo, Color Doppler, Cardiac Doppler and Strain Analysis Indications:     Z09 Chemo  History:         Patient has no prior history of Echocardiogram examinations.  Emphysema; Risk Factors:Current Smoker.  Sonographer:     Charmayne Sheer RDCS (AE) Referring Phys:  [id number] Cammie Sickle Diagnosing Phys: Bartholome Bill MD  Sonographer Comments: Global longitudinal strain was attempted. IMPRESSIONS  1. Left ventricular ejection fraction, by estimation, is 70 to 75%. The left ventricle has hyperdynamic function. The left ventricle has no regional wall motion abnormalities. Left ventricular diastolic parameters are consistent with Grade I diastolic dysfunction (impaired relaxation).  2. Right ventricular systolic function is normal. The right ventricular size is mildly enlarged.  3. Moderate pleural effusion in the left lateral region.  4. The mitral valve is grossly normal. Trivial mitral valve regurgitation.  5. The aortic valve was not well visualized. Aortic valve regurgitation is mild. FINDINGS  Left Ventricle: Left ventricular ejection fraction, by estimation, is 70 to 75%. The left ventricle has hyperdynamic function. The left ventricle has no regional wall motion abnormalities. The left ventricular internal cavity size was normal in size. There is no left ventricular hypertrophy. Left ventricular diastolic parameters are consistent with Grade I diastolic dysfunction (impaired relaxation). Right Ventricle: The right ventricular size is mildly enlarged. No increase in right ventricular wall  thickness. Right ventricular systolic function is normal. Left Atrium: Left atrial size was normal in size. Right Atrium: Right atrial size was normal in size. Pericardium: There is no evidence of pericardial effusion. Mitral Valve: The mitral valve is grossly normal. Trivial mitral valve regurgitation. MV peak gradient, 8.1 mmHg. The mean mitral valve gradient is 3.0 mmHg. Tricuspid Valve: The tricuspid valve is not well visualized. Tricuspid valve regurgitation is mild. Aortic Valve: The aortic valve was not well visualized. Aortic valve regurgitation is mild. Aortic valve mean gradient measures 5.0 mmHg. Aortic valve peak gradient measures 11.7 mmHg. Aortic valve area, by VTI measures 1.70 cm. Pulmonic Valve: The pulmonic valve was not well visualized. Pulmonic valve regurgitation is trivial. Aorta: The aortic root is normal in size and structure. IAS/Shunts: The interatrial septum was not assessed. Additional Comments: There is a moderate pleural effusion in the left lateral region.  LEFT VENTRICLE PLAX 2D LVIDd:         4.10 cm  Diastology LVIDs:         2.50 cm  LV e' medial:    8.05 cm/s LV PW:         0.90 cm  LV E/e' medial:  8.9 LV IVS:        0.70 cm  LV e' lateral:   11.30 cm/s LVOT diam:     1.90 cm  LV E/e' lateral: 6.4 LV SV:         52 LV SV Index:   30 LVOT Area:     2.84 cm  RIGHT VENTRICLE RV Basal diam:  2.60 cm LEFT ATRIUM             Index       RIGHT ATRIUM           Index LA diam:        3.00 cm 1.72 cm/m  RA Area:     16.80 cm LA Vol (A2C):   31.0 ml 17.74 ml/m RA Volume:   47.00 ml  26.89 ml/m LA Vol (A4C):   36.7 ml 21.00 ml/m LA Biplane Vol: 36.5 ml 20.88 ml/m  AORTIC VALVE                    PULMONIC VALVE AV Area (Vmax):    1.89 cm  PV Vmax:       1.09 m/s AV Area (Vmean):   1.94 cm     PV Vmean:      85.500 cm/s AV Area (VTI):     1.70 cm     PV VTI:        0.195 m AV Vmax:           171.00 cm/s  PV Peak grad:  4.8 mmHg AV Vmean:          107.000 cm/s PV Mean grad:  3.0  mmHg AV VTI:            0.307 m AV Peak Grad:      11.7 mmHg AV Mean Grad:      5.0 mmHg LVOT Vmax:         114.00 cm/s LVOT Vmean:        73.400 cm/s LVOT VTI:          0.184 m LVOT/AV VTI ratio: 0.60  AORTA Ao Root diam: 3.40 cm MITRAL VALVE MV Area (PHT): 6.60 cm     SHUNTS MV Area VTI:   2.52 cm     Systemic VTI:  0.18 m MV Peak grad:  8.1 mmHg     Systemic Diam: 1.90 cm MV Mean grad:  3.0 mmHg MV Vmax:       1.42 m/s MV Vmean:      84.3 cm/s MV Decel Time: 115 msec MV E velocity: 72.00 cm/s MV A velocity: 103.00 cm/s MV E/A ratio:  0.70 Bartholome Bill MD Electronically signed by Bartholome Bill MD Signature Date/Time: 08/05/2020/12:26:13 PM    Final     Hypercalcemia #70 year old male patient newly diagnosed lymphoma/hypercalcemia is currently to the hospital for recalcitrant hypercalcemia  # 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 Continue calcitonin for now.  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  #Prognosis: Again reviewed with the patient and sister-extremely poor given the aggressive nature of lymphoma/also poor prognostic factors-hypercalcemia.  Patient poor candidate for therapy; agree with hospice.  Given the recalcitrant hypercalcemia-I suspect patient's calcium will start going up in the next 1 week or so-which untreated would be fatal.  Patient wants a trial of Hospice at home patient/sister in agreement.  Discussed with Dr.Sreenath.    Cammie Sickle, MD 08/06/2020  7:53 PM

## 2020-08-06 NOTE — Progress Notes (Signed)
Was read PROGRESS NOTE    Patrick Shepard  YWV:[id number] DOB: 1950-03-19 DOA: 08/03/2020 PCP: Mechele Claude, FNP   Brief Narrative:  70 y.o. male seen in ed with complaints of abnormal labs including elevated calcium and generalized weakness.  Patient required admission for hypercalcemia 07/19/2020 through 07/25/2020 with admission calcium of 13.8 for which he received IV fluids, calcitonin, and zoledronic acid on 07/20/2020.  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  Patient went to his oncology appointment on 08/03/2020, the day of admission, and was found to have recurrence of hypercalcemia along with mild hypotension, so he was sent to the emergency department.  Patient reports he does have generalized weakness that has been present for days, is located all over, is constant, and is characterized as a fatigue.  Weakness is alleviated by nothing and exacerbated by exertion.  Patient denies focal weakness, headache, chest pain, palpitations, shortness of breath, cough, fevers, abdominal pain, vomiting, or bloody stool.  Patient did meet with palliative care as an outpatient on the date of admission and signed a DNR for CODE STATUS.  Patient was seen in consultation by oncology.  Initially the plan was to send the patient home potentially with hospice care as he using any kind of treatment however after repeated discussion the patient has agreed to stay.  Palliative care and oncology both following.  Patient on calcitonin and intravenous fluids. Calcium downtrending.  Patient remains nauseous with poor p.o. intake.  Patient was seen in consultation by palliative care and oncology.  After lengthy discussion decision was made to go home with hospice care services.  Appreciate involvement of hospice liaison.  As patient's family needs time to  prepare the house for him we will plan on discharge home on Friday 6/17   Assessment & Plan:   Principal Problem:   Hypercalcemia Active Problems:   Emphysema lung ([place])   Pancytopenia ([facility])   Mass of lower lobe of left lung   Hypotension due to hypovolemia   Hypomagnesemia   Nicotine dependence, cigarettes, uncomplicated   Palliative care encounter  Hypercalcemia Recent admission for the same.  Suspected secondary to underlying malignancy.  Hypercalcemia mild but symptomatic. Plan: Calcitonin x6 doses, last dose 6/16 p.m. Prednisone 100 mg daily x5 doses per oncology Status post 1 dose zoledronic acid 3 mg, no further need Continue normal saline 100 cc/h for now Stop checking labs Oncology follow-up   Emphysema/tobacco abuse Stable, nicotine patch.   Pancytopenia Stable we will follow.   Mass of left lower lobe/ lymphoma S/p biopsy:Possible diagnosis. Lymph node biopsy on 07/21/20. Oncology noted: Lymph node biopsy [right underarm core biopsy]-positive for lymphoma.  Discussed with Dr.Kraynie.   -However unfortunately unable to subclassify further at this time.   -Pathology is confirmed this is not a typical appearance of Hodgkin's lymphoma or carcinoma.  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   -Pathology currently under review by [facility].  Official read is pending. Plan: After discussion decision was made to forego treatment and proceed with a referral to hospice.  Plan on discharge home with hospice care services 6/17   Hypotension Attribute to hypovolemia and dehydration. Improved   Hypomagnesemia Stop lab draws   DVT prophylaxis: SCDs Code Status: DNR Family Communication: Sister [name] [phone] bedside on 6/16 Disposition Plan: Status is: Inpatient  Remains inpatient appropriate because:Inpatient level of care appropriate  due to severity of illness  Dispo: The patient is from: Home              Anticipated d/c is to: Home              Patient currently is not  medically stable to d/c.   Difficult to place patient No   Discharge home with hospice care services 6/17    Level of care: Med-Surg  Consultants:  Oncology Palliative care  Procedures:  None  Antimicrobials:  None   Subjective: Patient seen and examined.  Sitting up in bed.  In no visible distress.  Sister at bedside  Objective: Vitals:   08/05/20 2355 08/06/20 0030 08/06/20 0332 08/06/20 0822  BP: (!) 88/63 94/62 99/69  97/61  Pulse: 90 88 98 98  Resp:   16 20  Temp:   (!) 97.4 F (36.3 C) 98.1 F (36.7 C)  TempSrc:   Oral Oral  SpO2:   92% 97%  Height:        Intake/Output Summary (Last 24 hours) at 08/06/2020 1031 Last data filed at 08/06/2020 1022 Gross per 24 hour  Intake 3366.61 ml  Output 1855 ml  Net 1511.61 ml   There were no vitals filed for this visit.  Examination:  General exam: No acute distress Respiratory system: Clear to auscultation. Respiratory effort normal. Cardiovascular system: S1 & S2 heard, RRR. No JVD, murmurs, rubs, gallops or clicks. No pedal edema. Gastrointestinal system: Abdomen is nondistended, soft and nontender. No organomegaly or masses felt. Normal bowel sounds heard. Central nervous system: Alert and oriented. No focal neurological deficits. Extremities: Symmetric 5 x 5 power. Skin: No rashes, lesions or ulcers Psychiatry: Judgement and insight appear impaired. Mood & affect flattened.     Data Reviewed: I have personally reviewed following labs and imaging studies  CBC: Recent Labs  Lab 07/30/20 1523 08/03/20 1628 08/04/20 0942 08/05/20 0615  WBC 2.4* 2.3* 1.9* 2.2*  NEUTROABS 1.8 1.6* 1.4* 1.7  HGB 8.2* 8.2* 8.3* 7.1*  HCT 23.7* 24.1* 24.2* 21.0*  MCV 88.8 90.3 92.7 92.1  PLT 114* 140* 132* 196*   Basic Metabolic Panel: Recent Labs  Lab 07/30/20 1523 07/31/20 0929 08/03/20 1143 08/03/20 1628 08/03/20 1655 08/04/20 0942 08/05/20 0615  NA 128* 130* 128* 130*  --  130* 134*  K 4.9 4.3 4.4 4.2  --   3.7 3.6  CL 87* 90* 90* 94*  --  95* 98  CO2 31 29 30  32  --  29 29  GLUCOSE 84 93 103* 100*  --  122* 87  BUN 37* 33* 37* 36*  --  30* 29*  CREATININE 1.24 1.08 1.46* 1.25*  --  1.17 1.10  CALCIUM 12.7* 11.8* 12.3* 11.9*  --  11.0* 10.1  MG 1.8  --   --   --  1.6* 2.0  --   PHOS  --   --   --   --  3.7  --   --    GFR: Estimated Creatinine Clearance: 56.3 mL/min (by C-G formula based on SCr of 1.1 mg/dL). Liver Function Tests: Recent Labs  Lab 07/30/20 1523 08/03/20 1628  AST 70* 77*  ALT 38 37  ALKPHOS 101 113  BILITOT 0.9 0.8  PROT 5.3* 4.9*  ALBUMIN 3.0* 2.9*   No results for input(s): LIPASE, AMYLASE in the last 168 hours. No results for input(s): AMMONIA in the last 168 hours. Coagulation Profile: No results for input(s): INR, PROTIME in the last 168  hours. Cardiac Enzymes: No results for input(s): CKTOTAL, CKMB, CKMBINDEX, TROPONINI in the last 168 hours. BNP (last 3 results) No results for input(s): PROBNP in the last 8760 hours. HbA1C: No results for input(s): HGBA1C in the last 72 hours. CBG: No results for input(s): GLUCAP in the last 168 hours. Lipid Profile: No results for input(s): CHOL, HDL, LDLCALC, TRIG, CHOLHDL, LDLDIRECT in the last 72 hours. Thyroid Function Tests: No results for input(s): TSH, T4TOTAL, FREET4, T3FREE, THYROIDAB in the last 72 hours. Anemia Panel: No results for input(s): VITAMINB12, FOLATE, FERRITIN, TIBC, IRON, RETICCTPCT in the last 72 hours. Sepsis Labs: No results for input(s): PROCALCITON, LATICACIDVEN in the last 168 hours.  Recent Results (from the past 240 hour(s))  SARS CORONAVIRUS 2 (TAT 6-24 HRS) Nasopharyngeal Nasopharyngeal Swab     Status: None   Collection Time: 08/03/20  4:55 PM   Specimen: Nasopharyngeal Swab  Result Value Ref Range Status   SARS Coronavirus 2 NEGATIVE NEGATIVE Final    Comment: (NOTE) SARS-CoV-2 target nucleic acids are NOT DETECTED.  The SARS-CoV-2 RNA is generally detectable in upper and  lower respiratory specimens during the acute phase of infection. Negative results do not preclude SARS-CoV-2 infection, do not rule out co-infections with other pathogens, and should not be used as the sole basis for treatment or other patient management decisions. Negative results must be combined with clinical observations, patient history, and epidemiological information. The expected result is Negative.  Fact Sheet for Patients: SugarRoll.be  Fact Sheet for Healthcare Providers: https://www.woods-mathews.com/  This test is not yet approved or cleared by the [place] FDA and  has been authorized for detection and/or diagnosis of SARS-CoV-2 by FDA under an Emergency Use Authorization (EUA). This EUA will remain  in effect (meaning this test can be used) for the duration of the COVID-19 declaration under Se ction 564(b)(1) of the Act, 21 U.S.C. section 360bbb-3(b)(1), unless the authorization is terminated or revoked sooner.  Performed at [facility], [place] [address] [place], [place] [address]          Radiology Studies: ECHOCARDIOGRAM COMPLETE  Result Date: 08/05/2020    ECHOCARDIOGRAM REPORT   Patient Name:   Patrick Shepard Date of Exam: 08/05/2020 Medical Rec #:  [id number]          Height:       68.0 in Accession #:    [id number]         Weight:       138.4 lb Date of Birth:  1950/11/26          BSA:          1.748 m Patient Age:    68 years           BP:           93/65 mmHg Patient Gender: M                  HR:           96 bpm. Exam Location:  [facility] Procedure: 2D Echo, Color Doppler, Cardiac Doppler and Strain Analysis Indications:     Z09 Chemo  History:         Patient has no prior history of Echocardiogram examinations.                  Emphysema; Risk Factors:Current Smoker.  Sonographer:     Charmayne Sheer RDCS (AE) Referring Phys:  [id number] Cammie Sickle Diagnosing Phys: Bartholome Bill MD  Sonographer Comments:  Global longitudinal strain was attempted. IMPRESSIONS  1. Left ventricular ejection fraction, by estimation, is 70 to 75%. The left ventricle has hyperdynamic function. The left ventricle has no regional wall motion abnormalities. Left ventricular diastolic parameters are consistent with Grade I diastolic dysfunction (impaired relaxation).  2. Right ventricular systolic function is normal. The right ventricular size is mildly enlarged.  3. Moderate pleural effusion in the left lateral region.  4. The mitral valve is grossly normal. Trivial mitral valve regurgitation.  5. The aortic valve was not well visualized. Aortic valve regurgitation is mild. FINDINGS  Left Ventricle: Left ventricular ejection fraction, by estimation, is 70 to 75%. The left ventricle has hyperdynamic function. The left ventricle has no regional wall motion abnormalities. The left ventricular internal cavity size was normal in size. There is no left ventricular hypertrophy. Left ventricular diastolic parameters are consistent with Grade I diastolic dysfunction (impaired relaxation). Right Ventricle: The right ventricular size is mildly enlarged. No increase in right ventricular wall thickness. Right ventricular systolic function is normal. Left Atrium: Left atrial size was normal in size. Right Atrium: Right atrial size was normal in size. Pericardium: There is no evidence of pericardial effusion. Mitral Valve: The mitral valve is grossly normal. Trivial mitral valve regurgitation. MV peak gradient, 8.1 mmHg. The mean mitral valve gradient is 3.0 mmHg. Tricuspid Valve: The tricuspid valve is not well visualized. Tricuspid valve regurgitation is mild. Aortic Valve: The aortic valve was not well visualized. Aortic valve regurgitation is mild. Aortic valve mean gradient measures 5.0 mmHg. Aortic valve peak gradient measures 11.7 mmHg. Aortic valve area, by VTI measures 1.70 cm. Pulmonic Valve: The pulmonic valve was not well visualized. Pulmonic  valve regurgitation is trivial. Aorta: The aortic root is normal in size and structure. IAS/Shunts: The interatrial septum was not assessed. Additional Comments: There is a moderate pleural effusion in the left lateral region.  LEFT VENTRICLE PLAX 2D LVIDd:         4.10 cm  Diastology LVIDs:         2.50 cm  LV e' medial:    8.05 cm/s LV PW:         0.90 cm  LV E/e' medial:  8.9 LV IVS:        0.70 cm  LV e' lateral:   11.30 cm/s LVOT diam:     1.90 cm  LV E/e' lateral: 6.4 LV SV:         52 LV SV Index:   30 LVOT Area:     2.84 cm  RIGHT VENTRICLE RV Basal diam:  2.60 cm LEFT ATRIUM             Index       RIGHT ATRIUM           Index LA diam:        3.00 cm 1.72 cm/m  RA Area:     16.80 cm LA Vol (A2C):   31.0 ml 17.74 ml/m RA Volume:   47.00 ml  26.89 ml/m LA Vol (A4C):   36.7 ml 21.00 ml/m LA Biplane Vol: 36.5 ml 20.88 ml/m  AORTIC VALVE                    PULMONIC VALVE AV Area (Vmax):    1.89 cm     PV Vmax:       1.09 m/s AV Area (Vmean):   1.94 cm     PV Vmean:  85.500 cm/s AV Area (VTI):     1.70 cm     PV VTI:        0.195 m AV Vmax:           171.00 cm/s  PV Peak grad:  4.8 mmHg AV Vmean:          107.000 cm/s PV Mean grad:  3.0 mmHg AV VTI:            0.307 m AV Peak Grad:      11.7 mmHg AV Mean Grad:      5.0 mmHg LVOT Vmax:         114.00 cm/s LVOT Vmean:        73.400 cm/s LVOT VTI:          0.184 m LVOT/AV VTI ratio: 0.60  AORTA Ao Root diam: 3.40 cm MITRAL VALVE MV Area (PHT): 6.60 cm     SHUNTS MV Area VTI:   2.52 cm     Systemic VTI:  0.18 m MV Peak grad:  8.1 mmHg     Systemic Diam: 1.90 cm MV Mean grad:  3.0 mmHg MV Vmax:       1.42 m/s MV Vmean:      84.3 cm/s MV Decel Time: 115 msec MV E velocity: 72.00 cm/s MV A velocity: 103.00 cm/s MV E/A ratio:  0.70 Bartholome Bill MD Electronically signed by Bartholome Bill MD Signature Date/Time: 08/05/2020/12:26:13 PM    Final         Scheduled Meds:  calcitonin  4 Units/kg Intramuscular BID   nicotine  21 mg Transdermal Daily    pantoprazole  40 mg Oral Daily   predniSONE  100 mg Oral Q breakfast   Continuous Infusions:  sodium chloride 100 mL/hr at 08/06/20 0553   sodium chloride     promethazine (PHENERGAN) injection (IM or IVPB) Stopped (08/05/20 1132)     LOS: 3 days    Time spent: 15 minutes    Sidney Ace, MD [facility] Pager [phone]  If 7PM-7AM, please contact night-coverage 08/06/2020, 10:31 AM

## 2020-08-06 NOTE — Progress Notes (Signed)
Bp increased to 94/62 after administration of NSS 250 ml bolus. Pt remains asymptomatic.

## 2020-08-07 MED ORDER — ACETAMINOPHEN 325 MG PO TABS: 650.0000 mg | ORAL_TABLET | Freq: Four times a day (QID) | ORAL | Status: AC | PRN

## 2020-08-07 NOTE — Progress Notes (Signed)
Went through discharge paper with pt sister, pt left POV with his sister

## 2020-08-07 NOTE — Care Management Important Message (Signed)
Important Message  Patient Details  Name: Patrick Shepard MRN: [id number] Date of Birth: [birth year]   Medicare Important Message Given:  Other (see comment)  Patient is being discharged home with Hospice. Out of respect for the patient and family no Important Message from [facility] given.  [name] A Hallelujah Wysong 08/07/2020, 8:29 AM

## 2020-08-07 NOTE — Discharge Summary (Signed)
Physician Discharge Summary  Patrick Shepard KPT:[id number] DOB: 11/02/1950 DOA: 08/03/2020  PCP: Mechele Claude, FNP  Admit date: 08/03/2020 Discharge date: 08/07/2020  Admitted From: Home Disposition: Home with hospice  Recommendations for Outpatient Follow-up:  Per hospice providers  Home Health: No Equipment/Devices: None  Discharge Condition: Hospice CODE STATUS: DNR Diet recommendation: Regular  Brief/Interim Summary: 70 y.o. male seen in ed with complaints of abnormal labs including elevated calcium and generalized weakness.  Patient required admission for hypercalcemia 07/19/2020 through 07/25/2020 with admission calcium of 13.8 for which he received IV fluids, calcitonin, and zoledronic acid on 07/20/2020.  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  Patient went to his oncology appointment on 08/03/2020, the day of admission, and was found to have recurrence of hypercalcemia along with mild hypotension, so he was sent to the emergency department.  Patient reports he does have generalized weakness that has been present for days, is located all over, is constant, and is characterized as a fatigue.  Weakness is alleviated by nothing and exacerbated by exertion.  Patient denies focal weakness, headache, chest pain, palpitations, shortness of breath, cough, fevers, abdominal pain, vomiting, or bloody stool.  Patient did meet with palliative care as an outpatient on the date of admission and signed a DNR for CODE STATUS.   Patient was seen in consultation by oncology.  Initially the plan was to send the patient home potentially with hospice care as he using any kind of treatment however after repeated discussion the patient has agreed to stay.  Palliative care and oncology both following.  Patient on calcitonin and intravenous  fluids. Calcium downtrending.  Patient remains nauseous with poor p.o. intake.   Patient was seen in consultation by palliative care and oncology.  After lengthy discussion decision was made to go home with hospice care services.  Appreciate involvement of hospice liaison.  As patient's family needs time to prepare the house for him we will plan on discharge home on Friday 6/17  Discharge Diagnoses:  Principal Problem:   Hypercalcemia Active Problems:   Emphysema lung ([place])   Pancytopenia ([facility])   Mass of lower lobe of left lung   Hypotension due to hypovolemia   Hypomagnesemia   Nicotine dependence, cigarettes, uncomplicated   Palliative care encounter  Hypercalcemia portends poor prognosis in the setting of lymphoma.  Patient received calcitonin, prednisone, zoledronic acid, aggressive intravenous fluids.  Calcium had returned to reference range at time of discharge however without treatment this is likely to worsen.  As such after lengthy conversations with oncology, palliative care the decision was made to proceed with discharge home with hospice services.  Patient understands that he may need 24/7 care but would like to go home and attempt this first.  After conversation with hospice liaison this was the discharge plan.  Patient was discharged after delivery of DME to his home with hospice services on 6/17.  Discharge Instructions  Discharge Instructions     Diet - low sodium heart healthy   Complete by: As directed    Increase activity slowly   Complete by: As directed    No wound care   Complete by: As directed       Allergies as of 08/07/2020   No Known Allergies      Medication List     STOP taking these medications  magnesium oxide 400 (240 Mg) MG tablet Commonly known as: MAG-OX   nystatin 100000 UNIT/ML suspension Commonly known as: MYCOSTATIN   pantoprazole 40 MG tablet Commonly known as: Protonix   potassium chloride SA 20 MEQ tablet Commonly known  as: KLOR-CON   traZODone 50 MG tablet Commonly known as: DESYREL   Vitamin D3 25 MCG tablet Commonly known as: Vitamin D       TAKE these medications    acetaminophen 325 MG tablet Commonly known as: TYLENOL Take 2 tablets (650 mg total) by mouth every 6 (six) hours as needed for mild pain (or Fever >/= 101).   albuterol 108 (90 Base) MCG/ACT inhaler Commonly known as: VENTOLIN HFA Inhale 2 puffs into the lungs every 6 (six) hours as needed for wheezing or shortness of breath. What changed:  how much to take when to take this   feeding supplement Liqd Take 237 mLs by mouth 2 (two) times daily between meals.               Durable Medical Equipment  (From admission, onward)           Start     Ordered   08/06/20 1221  For home use only DME 3 n 1  Once        08/06/20 1221   08/06/20 1221  For home use only DME Walker rolling  Once       Question Answer Comment  Walker: With 5 Inch Wheels   Patient needs a walker to treat with the following condition Weakness      08/06/20 1221   08/06/20 1221  For home use only DME standard manual wheelchair with seat cushion  Once       Comments: Patient suffers from weakness which impairs their ability to perform daily activities like bathing, dressing, feeding, grooming, and toileting in the home.  A cane or crutch will not resolve issue with performing activities of daily living. A wheelchair will allow patient to safely perform daily activities. Patient can safely propel the wheelchair in the home or has a caregiver who can provide assistance. Length of need Lifetime. Accessories: elevating leg rests (ELRs), wheel locks, extensions and anti-tippers.   08/06/20 1221            No Known Allergies  Consultations: Oncology Palliative care   Procedures/Studies: CT ABDOMEN PELVIS WO CONTRAST  Result Date: 07/20/2020 CLINICAL DATA:  70 year old male with history of lymphadenopathy. EXAM: CT ABDOMEN AND PELVIS  WITHOUT CONTRAST TECHNIQUE: Multidetector CT imaging of the abdomen and pelvis was performed following the standard protocol without IV contrast. COMPARISON:  No priors. FINDINGS: Lower chest: 1.6 x 1.2 cm left lower lobe pulmonary nodule (axial image 3 of series 4). Atherosclerotic calcifications in the descending thoracic aorta as well as the left main, left anterior descending and right coronary arteries. Mild calcifications of the aortic valve. Hepatobiliary: Low-attenuation lesions are noted in the liver, incompletely characterized on today's non-contrast CT examination, but statistically likely to represent cysts, largest of which is in segment 4A (axial image 19 of series 2) measuring 4.0 x 2.6 cm. Amorphous intermediate attenuation material lying dependently in the gallbladder, likely to represent biliary sludge. Gallbladder is nearly decompressed. Pancreas: No definite pancreatic mass or peripancreatic fluid collections or inflammatory changes are noted on today's noncontrast CT examination. Spleen: Spleen is enlarged measuring 15.3 x 6.4 x 13.8 cm (estimated splenic volume of 665 mL) . Adrenals/Urinary Tract: 3 mm nonobstructive calculus in the interpolar collecting  system of the left kidney. No additional calculi are noted within the right renal collecting system, along the course of either ureter, or within the lumen of the urinary bladder. No hydroureteronephrosis. Unenhanced appearance of the kidneys is otherwise unremarkable. Bilateral adrenal glands are normal in appearance. Urinary bladder is normal in appearance. Stomach/Bowel: Unenhanced appearance of the stomach is normal. No pathologic dilatation of small bowel or colon. The appendix is not confidently identified and may be surgically absent. Regardless, there are no inflammatory changes noted adjacent to the cecum to suggest the presence of an acute appendicitis at this time. Vascular/Lymphatic: Aortic atherosclerosis. Multiple enlarged  retroperitoneal lymph nodes, largest of which is in the left para-aortic nodal station adjacent to the left renal hilum (axial image 30 of series 3) measuring 1.8 x 1.5 cm. Reproductive: Prostate gland and seminal vesicles are unremarkable in appearance. Other: No significant volume of ascites.  No pneumoperitoneum. Musculoskeletal: There are no aggressive appearing lytic or blastic lesions noted in the visualized portions of the skeleton. Chronic appearing compression fracture of T12 with 25% loss of anterior vertebral body height. IMPRESSION: 1. Extensive retroperitoneal lymphadenopathy. There is also splenomegaly. Clinical correlation for signs and symptoms of lymphoproliferative disorder is recommended. 2. 1.6 x 1.2 cm left lower lobe pulmonary nodule concerning for neoplasm. Consider one of the following in 3 months for both low-risk and high-risk individuals: (a) repeat chest CT or (b) follow-up PET-CT. This recommendation follows the consensus statement: Guidelines for Management of Incidental Pulmonary Nodules Detected on CT Images: From the Fleischner Society 2017; Radiology 2017; 284:228-243. 3. Probable biliary sludge lying dependently in the gallbladder. 4. Aortic atherosclerosis. 5. Additional incidental findings, as above. Electronically Signed   By: Vinnie Langton M.D.   On: 07/20/2020 15:23   CT Chest Wo Contrast  Result Date: 07/19/2020 CLINICAL DATA:  Dyspnea. Interstitial lung disease suspected. Altered mental status over the last couple of days. EXAM: CT CHEST WITHOUT CONTRAST TECHNIQUE: Multidetector CT imaging of the chest was performed following the standard protocol without IV contrast. COMPARISON:  Current and prior chest radiographs. FINDINGS: Cardiovascular: Heart normal in size. Coronary artery calcifications. Aortic atherosclerosis. Mediastinum/Nodes: Enlarged right axillary lymph nodes, largest measuring 1.3 cm in short axis. There are shoddy subcentimeter neck base lymph nodes.  There are several prominent to mildly enlarged mediastinal lymph nodes. Azygos level right paratracheal node measures 1.3 cm in short axis. Subcarinal node measures 1.8 cm in short axis. Probable mildly enlarged inferior right hilar lymph node, 1.3 cm in short axis. Trachea and esophagus are unremarkable. Lungs/Pleura: Lungs demonstrate interstitial thickening peripheral cystic spaces, with a predominance in the mid to upper lungs. There is an ill-defined 1.1 cm nodular opacity in the left lower lobe, image 91, series 3. No convincing pneumonia. No pulmonary edema. No pleural effusion or pneumothorax. Upper Abdomen: Enlarged spleen, 15 cm in greatest transverse dimension. Prominent to mildly enlarged lymph nodes in the upper abdomen, in the visualized retroperitoneum and along the Peri celiac chain. Low-density liver lesions consistent with cysts. Musculoskeletal: Skeletal structures are demineralized. There multiple vertebral compression fractures, T4, T8, T9 as well as L1, which appear chronic. No bone lesions. IMPRESSION: 1. Interstitial lung disease reflected by peripheral cystic change, interstitial thickening, areas of honeycombing and architectural distortion, with a mid to upper lung predominance. 2. 1.1 cm ill-defined nodular opacity in the left lower lobe. This could be inflammatory. Neoplastic disease is possible. Consider one of the following in 3 months for both low-risk and high-risk individuals: (a) repeat  chest CT, (b) follow-up PET-CT, or (c) tissue sampling. This recommendation follows the consensus statement: Guidelines for Management of Incidental Pulmonary Nodules Detected on CT Images: From the Fleischner Society 2017; Radiology 2017; 284:228-243. No other findings in the lungs to suggest active inflammation or infection. No pulmonary edema. 3. Enlarged lymph nodes, most apparent in the right axilla, milder adenopathy along the mediastinum and right hilum, with adenopathy also noted in the  visualized upper abdomen. There is also splenomegaly. The combination of findings is concerning for a lymphoproliferative disorder including lymphoma. 4. Coronary artery calcifications and aortic atherosclerosis. Aortic Atherosclerosis (ICD10-I70.0). Electronically Signed   By: Lajean Manes M.D.   On: 07/19/2020 21:07   DG Chest Portable 1 View  Result Date: 07/19/2020 CLINICAL DATA:  Weakness. EXAM: PORTABLE CHEST 1 VIEW COMPARISON:  02/10/2011. FINDINGS: Cardiac silhouette is normal in size. Normal mediastinal and hilar contours. Lungs show bilateral interstitial thickening similar to the prior exam allowing for differences in radiographic technique. No lung consolidation. No pleural effusion or pneumothorax. Skeletal structures are grossly intact. IMPRESSION: 1. No acute cardiopulmonary disease. 2. Chronic bilateral interstitial thickening. Electronically Signed   By: Lajean Manes M.D.   On: 07/19/2020 19:56   ECHOCARDIOGRAM COMPLETE  Result Date: 08/05/2020    ECHOCARDIOGRAM REPORT   Patient Name:   Patrick Shepard Date of Exam: 08/05/2020 Medical Rec #:  [id number]          Height:       68.0 in Accession #:    [id number]         Weight:       138.4 lb Date of Birth:  03-17-1950          BSA:          1.748 m Patient Age:    42 years           BP:           93/65 mmHg Patient Gender: M                  HR:           96 bpm. Exam Location:  [facility] Procedure: 2D Echo, Color Doppler, Cardiac Doppler and Strain Analysis Indications:     Z09 Chemo  History:         Patient has no prior history of Echocardiogram examinations.                  Emphysema; Risk Factors:Current Smoker.  Sonographer:     Charmayne Sheer RDCS (AE) Referring Phys:  [id number] Cammie Sickle Diagnosing Phys: Bartholome Bill MD  Sonographer Comments: Global longitudinal strain was attempted. IMPRESSIONS  1. Left ventricular ejection fraction, by estimation, is 70 to 75%. The left ventricle has hyperdynamic function. The left ventricle has  no regional wall motion abnormalities. Left ventricular diastolic parameters are consistent with Grade I diastolic dysfunction (impaired relaxation).  2. Right ventricular systolic function is normal. The right ventricular size is mildly enlarged.  3. Moderate pleural effusion in the left lateral region.  4. The mitral valve is grossly normal. Trivial mitral valve regurgitation.  5. The aortic valve was not well visualized. Aortic valve regurgitation is mild. FINDINGS  Left Ventricle: Left ventricular ejection fraction, by estimation, is 70 to 75%. The left ventricle has hyperdynamic function. The left ventricle has no regional wall motion abnormalities. The left ventricular internal cavity size was normal in size. There is no left ventricular hypertrophy. Left ventricular diastolic parameters are  consistent with Grade I diastolic dysfunction (impaired relaxation). Right Ventricle: The right ventricular size is mildly enlarged. No increase in right ventricular wall thickness. Right ventricular systolic function is normal. Left Atrium: Left atrial size was normal in size. Right Atrium: Right atrial size was normal in size. Pericardium: There is no evidence of pericardial effusion. Mitral Valve: The mitral valve is grossly normal. Trivial mitral valve regurgitation. MV peak gradient, 8.1 mmHg. The mean mitral valve gradient is 3.0 mmHg. Tricuspid Valve: The tricuspid valve is not well visualized. Tricuspid valve regurgitation is mild. Aortic Valve: The aortic valve was not well visualized. Aortic valve regurgitation is mild. Aortic valve mean gradient measures 5.0 mmHg. Aortic valve peak gradient measures 11.7 mmHg. Aortic valve area, by VTI measures 1.70 cm. Pulmonic Valve: The pulmonic valve was not well visualized. Pulmonic valve regurgitation is trivial. Aorta: The aortic root is normal in size and structure. IAS/Shunts: The interatrial septum was not assessed. Additional Comments: There is a moderate pleural  effusion in the left lateral region.  LEFT VENTRICLE PLAX 2D LVIDd:         4.10 cm  Diastology LVIDs:         2.50 cm  LV e' medial:    8.05 cm/s LV PW:         0.90 cm  LV E/e' medial:  8.9 LV IVS:        0.70 cm  LV e' lateral:   11.30 cm/s LVOT diam:     1.90 cm  LV E/e' lateral: 6.4 LV SV:         52 LV SV Index:   30 LVOT Area:     2.84 cm  RIGHT VENTRICLE RV Basal diam:  2.60 cm LEFT ATRIUM             Index       RIGHT ATRIUM           Index LA diam:        3.00 cm 1.72 cm/m  RA Area:     16.80 cm LA Vol (A2C):   31.0 ml 17.74 ml/m RA Volume:   47.00 ml  26.89 ml/m LA Vol (A4C):   36.7 ml 21.00 ml/m LA Biplane Vol: 36.5 ml 20.88 ml/m  AORTIC VALVE                    PULMONIC VALVE AV Area (Vmax):    1.89 cm     PV Vmax:       1.09 m/s AV Area (Vmean):   1.94 cm     PV Vmean:      85.500 cm/s AV Area (VTI):     1.70 cm     PV VTI:        0.195 m AV Vmax:           171.00 cm/s  PV Peak grad:  4.8 mmHg AV Vmean:          107.000 cm/s PV Mean grad:  3.0 mmHg AV VTI:            0.307 m AV Peak Grad:      11.7 mmHg AV Mean Grad:      5.0 mmHg LVOT Vmax:         114.00 cm/s LVOT Vmean:        73.400 cm/s LVOT VTI:          0.184 m LVOT/AV VTI ratio: 0.60  AORTA Ao Root diam: 3.40 cm  MITRAL VALVE MV Area (PHT): 6.60 cm     SHUNTS MV Area VTI:   2.52 cm     Systemic VTI:  0.18 m MV Peak grad:  8.1 mmHg     Systemic Diam: 1.90 cm MV Mean grad:  3.0 mmHg MV Vmax:       1.42 m/s MV Vmean:      84.3 cm/s MV Decel Time: 115 msec MV E velocity: 72.00 cm/s MV A velocity: 103.00 cm/s MV E/A ratio:  0.70 Bartholome Bill MD Electronically signed by Bartholome Bill MD Signature Date/Time: 08/05/2020/12:26:13 PM    Final    [place] CORE BIOPSY (LYMPH NODES)  Result Date: 07/21/2020 INDICATION: 70 year old male presenting with weight loss, left lower lobe mass, and lymphadenopathy. EXAM: ULTRASOUND GUIDED core BIOPSY OF right axillary lymph node. MEDICATIONS: None. ANESTHESIA/SEDATION: Fentanyl 25 mcg IV; Versed 0.5 mg IV  Moderate Sedation Time:  7 The patient was continuously monitored during the procedure by the interventional radiology nurse under my direct supervision. PROCEDURE: The procedure, risks, benefits, and alternatives were explained to the patient. Questions regarding the procedure were encouraged and answered. The patient understands and consents to the procedure. The right axilla was prepped with chlorhexidine in a sterile fashion, and a sterile drape was applied covering the operative field. A sterile gown and sterile gloves were used for the procedure. Preprocedure ultrasound evaluation demonstrated multiple prominent right axillary lymph nodes. The dominant node was selected for biopsy. The procedure was planned. Local anesthesia was provided with 1% Lidocaine. A small skin nick was made. Under direct ultrasound visualization, a total of 3, 18 gauge core biopsies were obtained. The samples were placed on saline soaked Telfa and sent to Pathology. Postprocedural imaging demonstrated no evidence of surrounding hematoma. Sterile bandage was placed. The patient tolerated procedure well was transferred back to floor in stable condition. COMPLICATIONS: None immediate. FINDINGS: Right axillary lymphadenopathy. IMPRESSION: Technically successful ultrasound-guided right axillary lymph node biopsy. Ruthann Cancer, MD [facility] Electronically Signed   By: Ruthann Cancer MD   On: 07/21/2020 10:49   (Echo, Carotid, EGD, Colonoscopy, ERCP)    Subjective:   Discharge Exam: Vitals:   08/07/20 0410 08/07/20 0803  BP: 108/65 109/64  Pulse: 95 91  Resp: 18 17  Temp: 98.1 F (36.7 C) (!) 97.4 F (36.3 C)  SpO2: 96% 96%   Vitals:   08/06/20 2011 08/06/20 2309 08/07/20 0410 08/07/20 0803  BP: 95/71 114/69 108/65 109/64  Pulse: 93 92 95 91  Resp: 16 20 18 17   Temp: 98.1 F (36.7 C) 98 F (36.7 C) 98.1 F (36.7 C) (!) 97.4 F (36.3 C)  TempSrc: Oral   Oral Oral  SpO2: 95% 97% 96% 96%  Height:        General: Pt is alert, awake, not in acute distress Cardiovascular: RRR, S1/S2 +, no rubs, no gallops Respiratory: CTA bilaterally, no wheezing, no rhonchi Abdominal: Soft, NT, ND, bowel sounds + Extremities: no edema, no cyanosis    The results of significant diagnostics from this hospitalization (including imaging, microbiology, ancillary and laboratory) are listed below for reference.     Microbiology: Recent Results (from the past 240 hour(s))  SARS CORONAVIRUS 2 (TAT 6-24 HRS) Nasopharyngeal Nasopharyngeal Swab     Status: None   Collection Time: 08/03/20  4:55 PM   Specimen: Nasopharyngeal Swab  Result Value Ref Range Status   SARS Coronavirus 2 NEGATIVE NEGATIVE Final    Comment: (NOTE) SARS-CoV-2 target nucleic  acids are NOT DETECTED.  The SARS-CoV-2 RNA is generally detectable in upper and lower respiratory specimens during the acute phase of infection. Negative results do not preclude SARS-CoV-2 infection, do not rule out co-infections with other pathogens, and should not be used as the sole basis for treatment or other patient management decisions. Negative results must be combined with clinical observations, patient history, and epidemiological information. The expected result is Negative.  Fact Sheet for Patients: SugarRoll.be  Fact Sheet for Healthcare Providers: https://www.woods-mathews.com/  This test is not yet approved or cleared by the [place] FDA and  has been authorized for detection and/or diagnosis of SARS-CoV-2 by FDA under an Emergency Use Authorization (EUA). This EUA will remain  in effect (meaning this test can be used) for the duration of the COVID-19 declaration under Se ction 564(b)(1) of the Act, 21 U.S.C. section 360bbb-3(b)(1), unless the authorization is terminated or revoked sooner.  Performed at [facility], [place] [address]  [place], [place] [address]      Labs: BNP (last 3 results) No results for input(s): BNP in the last 8760 hours. Basic Metabolic Panel: Recent Labs  Lab 08/03/20 1143 08/03/20 1628 08/03/20 1655 08/04/20 0942 08/05/20 0615  NA 128* 130*  --  130* 134*  K 4.4 4.2  --  3.7 3.6  CL 90* 94*  --  95* 98  CO2 30 32  --  29 29  GLUCOSE 103* 100*  --  122* 87  BUN 37* 36*  --  30* 29*  CREATININE 1.46* 1.25*  --  1.17 1.10  CALCIUM 12.3* 11.9*  --  11.0* 10.1  MG  --   --  1.6* 2.0  --   PHOS  --   --  3.7  --   --    Liver Function Tests: Recent Labs  Lab 08/03/20 1628  AST 77*  ALT 37  ALKPHOS 113  BILITOT 0.8  PROT 4.9*  ALBUMIN 2.9*   No results for input(s): LIPASE, AMYLASE in the last 168 hours. No results for input(s): AMMONIA in the last 168 hours. CBC: Recent Labs  Lab 08/03/20 1628 08/04/20 0942 08/05/20 0615  WBC 2.3* 1.9* 2.2*  NEUTROABS 1.6* 1.4* 1.7  HGB 8.2* 8.3* 7.1*  HCT 24.1* 24.2* 21.0*  MCV 90.3 92.7 92.1  PLT 140* 132* 127*   Cardiac Enzymes: No results for input(s): CKTOTAL, CKMB, CKMBINDEX, TROPONINI in the last 168 hours. BNP: Invalid input(s): POCBNP CBG: No results for input(s): GLUCAP in the last 168 hours. D-Dimer No results for input(s): DDIMER in the last 72 hours. Hgb A1c No results for input(s): HGBA1C in the last 72 hours. Lipid Profile No results for input(s): CHOL, HDL, LDLCALC, TRIG, CHOLHDL, LDLDIRECT in the last 72 hours. Thyroid function studies No results for input(s): TSH, T4TOTAL, T3FREE, THYROIDAB in the last 72 hours.  Invalid input(s): FREET3 Anemia work up No results for input(s): VITAMINB12, FOLATE, FERRITIN, TIBC, IRON, RETICCTPCT in the last 72 hours. Urinalysis    Component Value Date/Time   COLORURINE YELLOW (A) 07/19/2020 2044   APPEARANCEUR CLEAR (A) 07/19/2020 2044   LABSPEC 1.011 07/19/2020 2044   PHURINE 5.0 07/19/2020 2044   GLUCOSEU NEGATIVE 07/19/2020 2044   HGBUR SMALL (A) 07/19/2020 2044    BILIRUBINUR NEGATIVE 07/19/2020 2044   KETONESUR NEGATIVE 07/19/2020 2044   PROTEINUR NEGATIVE 07/19/2020 2044   NITRITE NEGATIVE 07/19/2020 2044   LEUKOCYTESUR NEGATIVE 07/19/2020 2044   Sepsis Labs Invalid input(s): PROCALCITONIN,  WBC,  LACTICIDVEN  Microbiology Recent Results (from the past 240 hour(s))  SARS CORONAVIRUS 2 (TAT 6-24 HRS) Nasopharyngeal Nasopharyngeal Swab     Status: None   Collection Time: 08/03/20  4:55 PM   Specimen: Nasopharyngeal Swab  Result Value Ref Range Status   SARS Coronavirus 2 NEGATIVE NEGATIVE Final    Comment: (NOTE) SARS-CoV-2 target nucleic acids are NOT DETECTED.  The SARS-CoV-2 RNA is generally detectable in upper and lower respiratory specimens during the acute phase of infection. Negative results do not preclude SARS-CoV-2 infection, do not rule out co-infections with other pathogens, and should not be used as the sole basis for treatment or other patient management decisions. Negative results must be combined with clinical observations, patient history, and epidemiological information. The expected result is Negative.  Fact Sheet for Patients: SugarRoll.be  Fact Sheet for Healthcare Providers: https://www.woods-mathews.com/  This test is not yet approved or cleared by the [place] FDA and  has been authorized for detection and/or diagnosis of SARS-CoV-2 by FDA under an Emergency Use Authorization (EUA). This EUA will remain  in effect (meaning this test can be used) for the duration of the COVID-19 declaration under Se ction 564(b)(1) of the Act, 21 U.S.C. section 360bbb-3(b)(1), unless the authorization is terminated or revoked sooner.  Performed at [facility], [place] [address], [place], [place] [address]      Time coordinating discharge: Over 30 minutes  SIGNED:   Sidney Ace, MD  [facility] 08/07/2020, 12:58 PM Pager   If 7PM-7AM, please contact  night-coverage

## 2020-08-11 ENCOUNTER — Telehealth: Payer: Self-pay | Admitting: Hospice and Palliative Medicine

## 2020-08-11 MED ORDER — OXYCODONE HCL 5 MG PO TABS: 5.0000 mg | ORAL_TABLET | ORAL | 0 refills | Status: DC | PRN

## 2020-08-11 MED ORDER — LORAZEPAM 0.5 MG PO TABS: 0.5000 mg | ORAL_TABLET | Freq: Three times a day (TID) | ORAL | 0 refills | Status: AC

## 2020-08-11 NOTE — Telephone Encounter (Signed)
Received a call from patient's hospice nurse.  Patient is home and having periods of anxiety and occasional generalized pain.  Will send Rx for oxycodone and lorazepam.

## 2020-10-19 ENCOUNTER — Telehealth: Payer: Self-pay | Admitting: *Deleted

## 2020-10-19 NOTE — Telephone Encounter (Signed)
ERROR

## 2020-10-21 ENCOUNTER — Telehealth: Payer: Self-pay | Admitting: Hospice and Palliative Medicine

## 2020-10-21 NOTE — Telephone Encounter (Signed)
Received a call from patient's hospice nurse.  Patient is requesting to discontinue potassium supplement.  It appears that this was supposed to be discontinued at time of discharge from the hospital.  Okay from our standpoint to discontinue and lessen pill burden.

## 2020-10-27 ENCOUNTER — Other Ambulatory Visit: Payer: Self-pay | Admitting: Internal Medicine

## 2021-02-01 ENCOUNTER — Telehealth: Payer: Self-pay | Admitting: Hospice and Palliative Medicine

## 2021-02-01 NOTE — Telephone Encounter (Signed)
I received a call from patient's hospice nurse, Anderson Malta.  Reportedly, patient is doing well and hospice is unsure if they can keep him on service.  When he was last seen in June, prognosis was felt to be quite poor given poor prognosticators such as recalcitrant hypercalcemia and pancytopenia.  Suggested that she could drawl CBC and CMP.  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  We could follow-up in the cancer center if that would be beneficial for reevaluation.  Nurse will let us know.

## 2021-02-05 ENCOUNTER — Telehealth: Payer: Self-pay | Admitting: Hospice and Palliative Medicine

## 2021-02-05 DIAGNOSIS — R590 Localized enlarged lymph nodes: Secondary | ICD-10-CM

## 2021-02-05 NOTE — Addendum Note (Signed)
Addended by: Vanice Sarah on: 02/05/2021 02:12 PM   Modules accepted: Orders

## 2021-02-05 NOTE — Telephone Encounter (Signed)
Mailed appt °

## 2021-02-05 NOTE — Telephone Encounter (Signed)
Caregiver called to set up appt for labs. States that Vonna Kotyk wants them done. Call back at [phone]

## 2021-02-21 ENCOUNTER — Encounter: Payer: Self-pay | Admitting: Internal Medicine

## 2021-02-23 ENCOUNTER — Other Ambulatory Visit: Payer: Self-pay

## 2021-02-23 ENCOUNTER — Inpatient Hospital Stay (HOSPITAL_BASED_OUTPATIENT_CLINIC_OR_DEPARTMENT_OTHER): Payer: Medicare Other | Admitting: Internal Medicine

## 2021-02-23 ENCOUNTER — Inpatient Hospital Stay: Payer: Medicare Other | Attending: Internal Medicine

## 2021-02-23 ENCOUNTER — Encounter: Payer: Self-pay | Admitting: Internal Medicine

## 2021-02-23 DIAGNOSIS — Z87891 Personal history of nicotine dependence: Secondary | ICD-10-CM | POA: Diagnosis not present

## 2021-02-23 DIAGNOSIS — R590 Localized enlarged lymph nodes: Secondary | ICD-10-CM

## 2021-02-23 DIAGNOSIS — C8590 Non-Hodgkin lymphoma, unspecified, unspecified site: Secondary | ICD-10-CM | POA: Diagnosis present

## 2021-02-23 LAB — CBC WITH DIFFERENTIAL/PLATELET
Abs Immature Granulocytes: 0.04 10*3/uL (ref 0.00–0.07)
Basophils Absolute: 0 10*3/uL (ref 0.0–0.1)
Basophils Relative: 1 %
Eosinophils Absolute: 0 10*3/uL (ref 0.0–0.5)
Eosinophils Relative: 0 %
HCT: 36 % — ABNORMAL LOW (ref 39.0–52.0)
Hemoglobin: 11.9 g/dL — ABNORMAL LOW (ref 13.0–17.0)
Immature Granulocytes: 1 %
Lymphocytes Relative: 22 %
Lymphs Abs: 0.8 10*3/uL (ref 0.7–4.0)
MCH: 30.1 pg (ref 26.0–34.0)
MCHC: 33.1 g/dL (ref 30.0–36.0)
MCV: 91.1 fL (ref 80.0–100.0)
Monocytes Absolute: 0.2 10*3/uL (ref 0.1–1.0)
Monocytes Relative: 7 %
Neutro Abs: 2.5 10*3/uL (ref 1.7–7.7)
Neutrophils Relative %: 69 %
Platelets: 183 10*3/uL (ref 150–400)
RBC: 3.95 MIL/uL — ABNORMAL LOW (ref 4.22–5.81)
RDW: 14.8 % (ref 11.5–15.5)
WBC: 3.5 10*3/uL — ABNORMAL LOW (ref 4.0–10.5)
nRBC: 0 % (ref 0.0–0.2)

## 2021-02-23 LAB — COMPREHENSIVE METABOLIC PANEL
ALT: 21 U/L (ref 0–44)
AST: 61 U/L — ABNORMAL HIGH (ref 15–41)
Albumin: 4 g/dL (ref 3.5–5.0)
Alkaline Phosphatase: 76 U/L (ref 38–126)
Anion gap: 11 (ref 5–15)
BUN: 16 mg/dL (ref 8–23)
CO2: 28 mmol/L (ref 22–32)
Calcium: 11.7 mg/dL — ABNORMAL HIGH (ref 8.9–10.3)
Chloride: 95 mmol/L — ABNORMAL LOW (ref 98–111)
Creatinine, Ser: 1.18 mg/dL (ref 0.61–1.24)
GFR, Estimated: >60 mL/min (ref >60)
Glucose, Bld: 112 mg/dL — ABNORMAL HIGH (ref 70–99)
Potassium: 3.2 mmol/L — ABNORMAL LOW (ref 3.5–5.1)
Sodium: 134 mmol/L — ABNORMAL LOW (ref 135–145)
Total Bilirubin: 0.6 mg/dL (ref 0.3–1.2)
Total Protein: 6.5 g/dL (ref 6.5–8.1)

## 2021-02-23 LAB — SEDIMENTATION RATE: Sed Rate: 2 mm/hr (ref 0–20)

## 2021-02-23 LAB — LACTATE DEHYDROGENASE: LDH: 358 U/L — ABNORMAL HIGH (ref 98–192)

## 2021-02-23 NOTE — Assessment & Plan Note (Addendum)
#   June 2022- DLBC- Lymph node biopsy [right underarm core biopsy]-positive for lymphoma.  Patient declined chemotherapy at the time of diagnosis.  Patient is is currently in hospice.  Clinically noted to have progressive disease in the neck/given the hypercalcemia.  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 HOLD any further interventions.  Continue hospice/see below.   # Prognosis/plan: #I discussed with the patient and sister regarding the option of systemic therapy/chemotherapy to help treat the cancer-again patient declines stating that "he is ready whenever the [facility] is ready".  I think is reasonable to continue hospice.  Discussed with [employer].  DISPOSITION: # follow up as needed- Dr.B

## 2021-02-23 NOTE — Patient Instructions (Signed)
#   continue hospice

## 2021-02-23 NOTE — Progress Notes (Signed)
[facility] NOTE  Patient Care Team: Mechele Claude, FNP as PCP - General (Family Medicine)  CHIEF COMPLAINTS/PURPOSE OF CONSULTATION: Hypercalcemia/   # MAY 2022-right axillary lymph node biopsy "LYMPHOMA"  #Hypercalcemia secondary to malignancy- Oncology History   No history exists.     HISTORY OF PRESENTING ILLNESS: Ambulating independently.  Accompanied by his sister. Patrick Shepard 71 y.o.  male diagnosis of diffuse large cell B-cell lymphoma [June 2022]; history of hypercalcemia-currently in hospice is here for follow-up.  Patient has been doing fairly well.  He denies any nausea vomiting abdominal pain.  Any chest pain or shortness of breath or cough.  Review of Systems  Constitutional:  Negative for chills, diaphoresis and fever.  HENT:  Negative for nosebleeds and sore throat.   Eyes:  Negative for double vision.  Respiratory:  Negative for cough, hemoptysis, sputum production and wheezing.   Cardiovascular:  Negative for chest pain, palpitations, orthopnea and leg swelling.  Gastrointestinal:  Negative for abdominal pain, blood in stool, constipation, diarrhea, heartburn, melena, nausea and vomiting.  Genitourinary:  Negative for dysuria, frequency and urgency.  Musculoskeletal:  Positive for back pain. Negative for joint pain.  Skin: Negative.  Negative for itching and rash.  Neurological:  Negative for tingling, focal weakness and headaches.  Endo/Heme/Allergies:  Does not bruise/bleed easily.  Psychiatric/Behavioral:  Negative for depression. The patient is not nervous/anxious and does not have insomnia.     MEDICAL HISTORY:  Past Medical History:  Diagnosis Date   Emphysema lung ([place])    Hypercalcemia    Lymphoma ([place]) 08/03/2020   Possible diagnosis. Lymph node biopsy on 07/21/20. Oncology noted: Lymph node biopsy [right underarm core biopsy]-positive for lymphoma.  Discussed with Dr.Kraynie.  However unfortunately unable to  subclassify further at this time.  Awaiting second opinion at tertiary center.   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 Did not have knee replacement   LAPAROTOMY     done at age 65 for intestinal issues    SOCIAL HISTORY: Social History   Socioeconomic History   Marital status: Single    Spouse name: Not on file   Number of children: Not on file   Years of education: Not on file   Highest education level: Not on file  Occupational History   Not on file  Tobacco Use   Smoking status: Former    Packs/day: 1.00    Types: Cigarettes    Quit date: 08/21/2020    Years since quitting: 0.5   Smokeless tobacco: Former  Scientific laboratory technician Use: Never used  Substance and Sexual Activity   Alcohol use: Not Currently   Drug use: Never   Sexual activity: Not on file  Other Topics Concern   Not on file  Social History Narrative   Not on file   Social Determinants of Health   Financial Resource Strain: Not on file  Food Insecurity: Not on file  Transportation Needs: Not on file  Physical Activity: Not on file  Stress: Not on file  Social Connections: Not on file  Intimate Partner Violence: Not on file    FAMILY HISTORY: Family History  Problem Relation Age of Onset   COPD Mother  Alcoholism Father    Cirrhosis Father     ALLERGIES:  has No Known Allergies.  MEDICATIONS:  Current Outpatient Medications  Medication Sig Dispense Refill   acetaminophen (TYLENOL) 325 MG tablet Take 2 tablets (650 mg total) by mouth every 6 (six) hours as needed for mild pain (or Fever >/= 101).     albuterol (VENTOLIN HFA) 108 (90 Base) MCG/ACT inhaler TAKE 2 PUFFS BY MOUTH EVERY 6 HOURS AS NEEDED FOR WHEEZE OR SHORTNESS OF BREATH 6.7 each 2    feeding supplement (ENSURE ENLIVE / ENSURE PLUS) LIQD Take 237 mLs by mouth 2 (two) times daily between meals. (Patient not taking: Reported on 02/23/2021) 237 mL 12   LORazepam (ATIVAN) 0.5 MG tablet Take 1 tablet (0.5 mg total) by mouth every 8 (eight) hours. (Patient not taking: Reported on 02/23/2021) 30 tablet 0   oxyCODONE (OXY IR/ROXICODONE) 5 MG immediate release tablet Take 1 tablet (5 mg total) by mouth every 4 (four) hours as needed for severe pain. (Patient not taking: Reported on 02/23/2021) 30 tablet 0   No current facility-administered medications for this visit.      Patrick Shepard Kitchen  PHYSICAL EXAMINATION: ECOG PERFORMANCE STATUS: 2 - Symptomatic, <50% confined to bed  Vitals:   02/23/21 0951  BP: (!) 146/78  Pulse: (!) 101  Temp: (!) 96 F (35.6 C)  SpO2: 99%    Filed Weights   02/23/21 0951  Weight: 156 lb (70.8 kg)   Enlarging bilateral neck adenopathy/mild adenopathy in the bilateral underarm.  Physical Exam HENT:     Head: Normocephalic and atraumatic.     Mouth/Throat:     Pharynx: No oropharyngeal exudate.  Eyes:     Pupils: Pupils are equal, round, and reactive to light.  Cardiovascular:     Rate and Rhythm: Normal rate and regular rhythm.  Pulmonary:     Effort: No respiratory distress.     Breath sounds: No wheezing.     Comments: Decreased breath sounds bilaterally at bases.  No wheeze or crackles Abdominal:     General: Bowel sounds are normal. There is no distension.     Palpations: Abdomen is soft. There is no mass.     Tenderness: There is no abdominal tenderness. There is no guarding or rebound.  Musculoskeletal:        General: No tenderness. Normal range of motion.     Cervical back: Normal range of motion and neck supple.  Skin:    General: Skin is warm.  Neurological:     Mental Status: He is alert and oriented to person, place, and time.  Psychiatric:        Mood and Affect: Affect normal.     LABORATORY DATA:  I have reviewed the data as  listed Lab Results  Component Value Date   WBC 3.5 (L) 02/23/2021   HGB 11.9 (L) 02/23/2021   HCT 36.0 (L) 02/23/2021   MCV 91.1 02/23/2021   PLT 183 02/23/2021   Recent Labs    07/20/20 1118 07/21/20 0626 07/30/20 1523 07/31/20 0929 08/03/20 1628 08/04/20 0942 08/05/20 0615 02/23/21 0932  NA  --    < > 128*   < > 130* 130* 134* 134*  K  --    < > 4.9   < > 4.2 3.7 3.6 3.2*  CL  --    < > 87*   < > 94* 95* 98 95*  CO2  --    < > 31   < >  32 29 29 28   GLUCOSE  --    < > 84   < > 100* 122* 87 112*  BUN  --    < > 37*   < > 36* 30* 29* 16  CREATININE  --    < > 1.24   < > 1.25* 1.17 1.10 1.18  CALCIUM  --    < > 12.7*   < > 11.9* 11.0* 10.1 11.7*  GFRNONAA  --    < > >60   < > >60 >60 >60 >60  PROT 5.0*   < > 5.3*  --  4.9*  --   --  6.5  ALBUMIN 3.0*   < > 3.0*  --  2.9*  --   --  4.0  AST 70*   < > 70*  --  77*  --   --  61*  ALT 22   < > 38  --  37  --   --  21  ALKPHOS 65   < > 101  --  113  --   --  76  BILITOT 1.6*   < > 0.9  --  0.8  --   --  0.6  BILIDIR 0.5*  --   --   --   --   --   --   --   IBILI 1.1*  --   --   --   --   --   --   --    < > = values in this interval not displayed.    RADIOGRAPHIC STUDIES: I have personally reviewed the radiological images as listed and agreed with the findings in the report. No results found.  ASSESSMENT & PLAN:   Hypercalcemia # June 2022- DLBC- Lymph node biopsy [right underarm core biopsy]-positive for lymphoma.  Patient declined chemotherapy at the time of diagnosis.  Patient is is currently in hospice.  Clinically noted to have progressive disease in the neck/given the hypercalcemia.  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 HOLD any further interventions.  Continue hospice/see below.   # Prognosis/plan: #I discussed with the patient and sister regarding the option of systemic therapy/chemotherapy to help treat  the cancer-again patient declines stating that "he is ready whenever the [facility] is ready".  I think is reasonable to continue hospice.  Discussed with [employer].  DISPOSITION: # follow up as needed- Dr.B     All questions were answered. The patient knows to call the clinic with any problems, questions or concerns.    Cammie Sickle, MD 02/23/2021 12:38 PM

## 2021-03-17 ENCOUNTER — Other Ambulatory Visit: Payer: Self-pay | Admitting: Hospice and Palliative Medicine

## 2021-03-17 MED ORDER — OXYCODONE HCL 5 MG PO TABS: 5.0000 mg | ORAL_TABLET | ORAL | 0 refills | Status: DC | PRN

## 2021-03-17 NOTE — Progress Notes (Signed)
Hospice nurse, Anderson Malta, requested refill of patient's oxycodone.  Rx sent to pharmacy.  Reportedly patient is declining and having more generalized pain.

## 2021-03-26 ENCOUNTER — Other Ambulatory Visit: Payer: Self-pay | Admitting: Hospice and Palliative Medicine

## 2021-03-26 MED ORDER — OXYCODONE HCL 5 MG PO TABS: 5.0000 mg | ORAL_TABLET | ORAL | 0 refills | Status: AC | PRN

## 2021-03-26 NOTE — Progress Notes (Signed)
Hospice nurse, Anderson Malta, requested refill of oxycodone. Patient is also requiring 2 tablets, so will liberalize dosing on new prescription.

## 2021-03-27 ENCOUNTER — Emergency Department
Admission: EM | Admit: 2021-03-27 | Discharge: 2021-03-27 | Disposition: A | Discharge status: Hospice | Attending: Emergency Medicine | Admitting: Emergency Medicine

## 2021-03-27 ENCOUNTER — Encounter: Payer: Self-pay | Admitting: Internal Medicine

## 2021-03-27 ENCOUNTER — Other Ambulatory Visit: Payer: Self-pay

## 2021-03-27 DIAGNOSIS — R339 Retention of urine, unspecified: Secondary | ICD-10-CM | POA: Insufficient documentation

## 2021-03-27 DIAGNOSIS — T83091A Other mechanical complication of indwelling urethral catheter, initial encounter: Secondary | ICD-10-CM | POA: Insufficient documentation

## 2021-03-27 DIAGNOSIS — R000 Tachycardia, unspecified: Secondary | ICD-10-CM | POA: Insufficient documentation

## 2021-03-27 DIAGNOSIS — Y732 Prosthetic and other implants, materials and accessory gastroenterology and urology devices associated with adverse incidents: Secondary | ICD-10-CM | POA: Diagnosis not present

## 2021-03-27 DIAGNOSIS — T839XXA Unspecified complication of genitourinary prosthetic device, implant and graft, initial encounter: Secondary | ICD-10-CM

## 2021-03-27 MED ORDER — MORPHINE SULFATE (PF) 2 MG/ML IV SOLN
2.0000 mg | Freq: Once | INTRAVENOUS | Status: AC
  Administered 2021-03-27: 2 mg via INTRAVENOUS
  Filled 2021-03-27: qty 1

## 2021-03-27 MED ORDER — LIDOCAINE HCL URETHRAL/MUCOSAL 2 % EX GEL
1.0000 "application " | Freq: Once | CUTANEOUS | Status: DC
  Filled 2021-03-27: qty 6

## 2021-03-27 MED ORDER — MORPHINE SULFATE (PF) 4 MG/ML IV SOLN
4.0000 mg | INTRAVENOUS | Status: DC | PRN
  Administered 2021-03-27: 4 mg via INTRAVENOUS
  Filled 2021-03-27: qty 1

## 2021-03-27 NOTE — ED Notes (Signed)
This RN spoke with Sheran Spine, RN from [facility] [facility] for more information on pts dispo. Per Sheran Spine, pt is scheduled for admission to [facility] at 9PM today. Phone number for [facility] is [phone].

## 2021-03-27 NOTE — ED Notes (Signed)
Sister given msg & phone # to return call to [place] at [facility] to complete consents for pt.

## 2021-03-27 NOTE — ED Notes (Signed)
This RN spoke with Patrick Shepard from [facility]. Patrick Shepard is needing to speak with pts sister for consents for pts admission this evening. Sister is not currently at bedside, but this RN will have her return the call to [place] at [phone].

## 2021-03-27 NOTE — ED Notes (Signed)
1L NS bolus started on pt per Dr. Cheri Fowler VO.

## 2021-03-27 NOTE — ED Notes (Signed)
[place], light green, blue, red, gray on ice, blood cx's x1 sent to lab.

## 2021-03-27 NOTE — ED Notes (Signed)
This RN called [facility] at [phone] & spoke with Linna Hoff, [place]. The plan is for this pt to be admitted to [facility] at 9PM, so best plan is to arrange for EMS transport from ER at 9PM. Pt will be going to Room 3. Address is [place] When pt leaves ER, call report to [phone].

## 2021-03-27 NOTE — ED Notes (Signed)
Pts family updated on POC/urologist.

## 2021-03-27 NOTE — ED Provider Notes (Signed)
[facility] Provider Note    Event Date/Time   First MD Initiated Contact with Patient 04/09/2021 1411     (approximate)   History   Urinary Retention (Pt BIB EMS from home b/c HHN unable to removed foley catheter.)   HPI BLISS TSANG is a 71 y.o. male who presents via EMS at his baseline mental status which is A&O x0 secondary to difficulty exchanging a Foley catheter.  Patient was reportedly getting home hospice and was getting ready for transport to hospice house when the home health care nurse had difficulty removing patient's Foley catheter.  Reportedly they could not deflate the Foley balloon and the catheter was unable to be extracted.  They did not notice any drainage from this catheter into the Foley bag.  EMS did note patient to be hypotensive and tachycardic upon their arrival as well as febrile.  In discussion with patient's family, they were aware that patient's health has been deteriorating with a possible infection and had not decided to pursue any further care or work-up at this time.  I spoke with family at length about their goals of care during this emergency department visit and they state that the only care they would like to receive is an exchange of patient's catheter.     Physical Exam   Triage Vital Signs: ED Triage Vitals  Enc Vitals Group     BP 04/13/2021 1423 (!) 74/54     Pulse Rate 04/15/2021 1423 (!) 113     Resp 04/05/2021 1423 14     Temp 03/24/2021 1423 99.3 F (37.4 C)     Temp Source 03/29/2021 1423 Axillary     SpO2 04/03/2021 1421 99 %     Weight 04/11/2021 1424 156 lb 1.4 oz (70.8 kg)     Height 04/19/2021 1424 6' (1.829 m)     Head Circumference --      Peak Flow --      Pain Score --      Pain Loc --      Pain Edu? --      Excl. in [place]? --     Most recent vital signs: Vitals:   04/04/2021 1519 04/04/2021 2038  BP: 101/66 (!) 61/38  Pulse: (!) 117 (!) 124  Resp:  14  Temp:    SpO2: 100% 99%    General: Somnolent, no  distress.  CV:  Good peripheral perfusion.  Resp:  Normal effort.  Abd:  No distention.  Other:  Elderly Caucasian male asleep in bed.  GCS 9   ED Results / Procedures / Treatments   Labs (all labs ordered are listed, but only abnormal results are displayed) Labs Reviewed - No data to display   PROCEDURES:  Critical Care performed: No  Procedures   MEDICATIONS ORDERED IN ED: Medications  morphine (PF) 2 MG/ML injection 2 mg (2 mg Intravenous Given 04/08/2021 1605)     IMPRESSION / MDM / [place] / ED COURSE  I reviewed the triage vital signs and the nursing notes.                              Differential diagnosis includes, but is not limited to, Foley catheter malfunction, UTI, urosepsis, prostatic hypertrophy  The patient is on the cardiac monitor to evaluate for evidence of arrhythmia and/or significant heart rate changes. Foley catheter patient is a 71 year old male with the above-stated past medical  history and HPI presents per EMS and family with issues replacing patient's Foley catheter prior to transport to his hospice care facility.  Initially, this Foley catheter was difficult to remove and tried all means at our disposal including attempting to cut off the Foley catheter, inject and subsequently remove all fluid from the Foley bulb, and depression of patient suprapubic area to allow for release with no luck.  I spoke to the on-call urologist who agreed to help troubleshoot this Foley catheter however when urology arrived, patient showed no difficulty with removal of this Foley catheter.  Patient was therefore discharged to hospice care facility and is pending transport at the end of my shift.    Dispo: Transport to hospice house      FINAL CLINICAL IMPRESSION(S) / ED DIAGNOSES   Final diagnoses:  Problem with Foley catheter, initial encounter ([place])     Rx / DC Orders   ED Discharge Orders     None        Note:  This document was prepared  using Dragon voice recognition software and may include unintentional dictation errors.   Naaman Plummer, MD 03/30/21 [phone]

## 2021-03-27 NOTE — ED Notes (Signed)
Pt brief changed and all linens changed, repositioned in bed and given warm blanket.

## 2021-03-27 NOTE — ED Notes (Signed)
Medical Necessity form completed & printed for EMS transport to [facility].

## 2021-03-27 NOTE — ED Triage Notes (Signed)
Pt BIB EMS from home b/c home hospice RN was unable to remove foley catheter. Pt is scheduled to be admitted to hospice house today. Pt given 10mg  oxycodone PO & 1.5mg  lorazepam PO at home prior to EMS arrival.

## 2021-03-27 NOTE — ED Notes (Signed)
This RN called [facility] to get info on hospice house where pt has a room ready for him. Plan is to send pt to the hospice house he was originally scheduled to be admitted to today. Awaiting a return call.

## 2021-03-27 NOTE — ED Notes (Signed)
Report called to Darryl Lent, RN at [facility]. Per Joaquim Lai, please leave PIV's in.

## 2021-04-21 DIAGNOSIS — 419620001 Death: Secondary | SNOMED CT | POA: Insufficient documentation

## 2021-04-21 DEATH — deceased

## 2021-10-11 IMAGING — CT CT ABD-PELV W/O CM
2 of 4 series · 14 of 46 positions shown, 16 images · non-contrast
Comparison: No priors.

CLINICAL DATA: 69-year-old male with history of lymphadenopathy.

EXAM:
CT ABDOMEN AND PELVIS WITHOUT CONTRAST
TECHNIQUE: Multidetector CT imaging of the abdomen and pelvis was performed
following the standard protocol without IV contrast.

[Series 2: routine abd/pel wo · axial · 0.74mm/px · z∈[-1098,-708]mm · 11 of 94 slices shown, 13 images]
[im 8/94  soft-tissue]
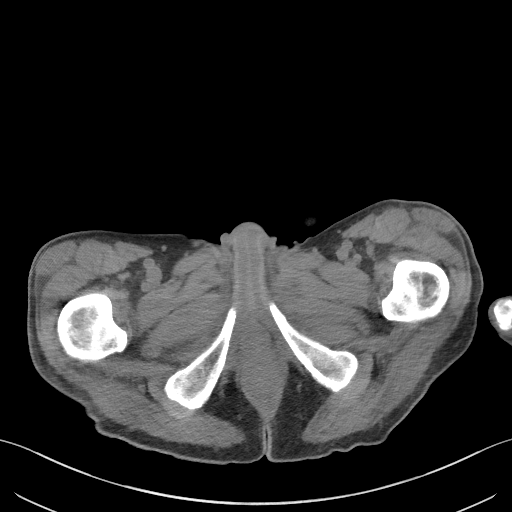
[im 8/94  bone]
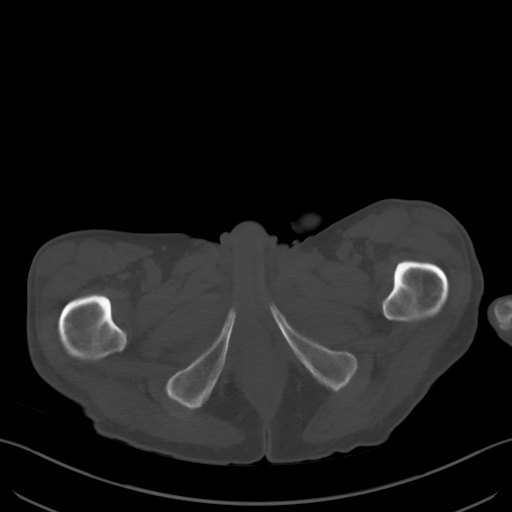
[im 16/94  soft-tissue]
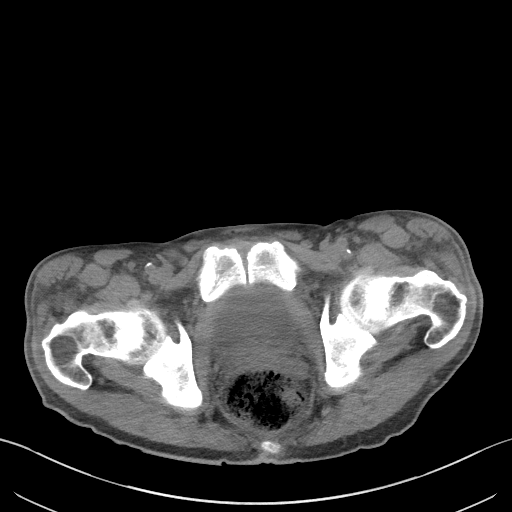
[im 24/94  soft-tissue]
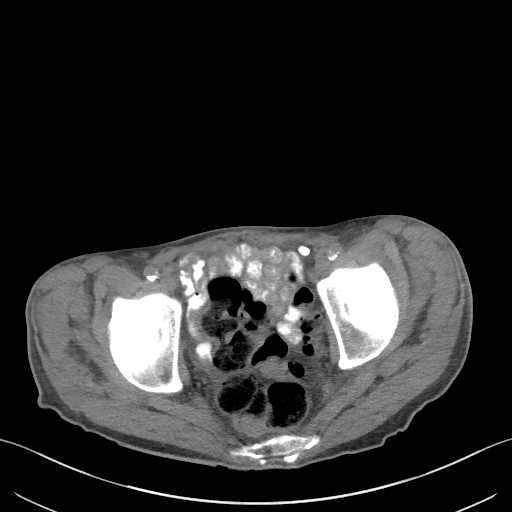
[im 32/94  soft-tissue]
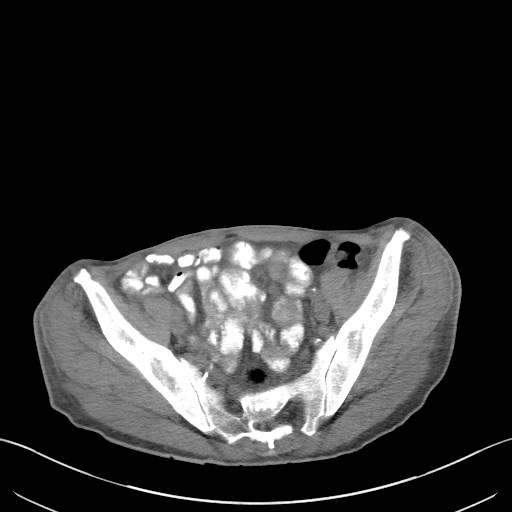
[im 39/94  soft-tissue]
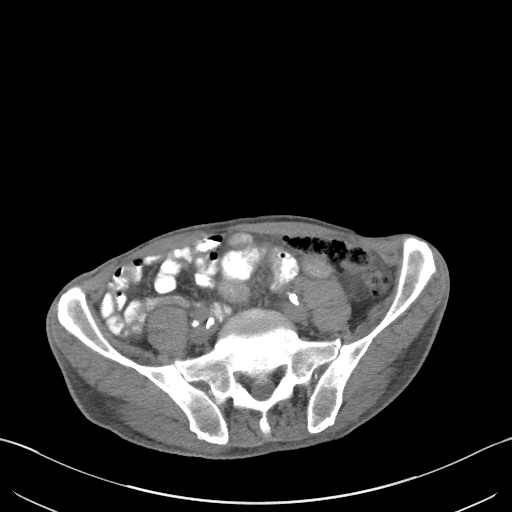
[im 47/94  soft-tissue]
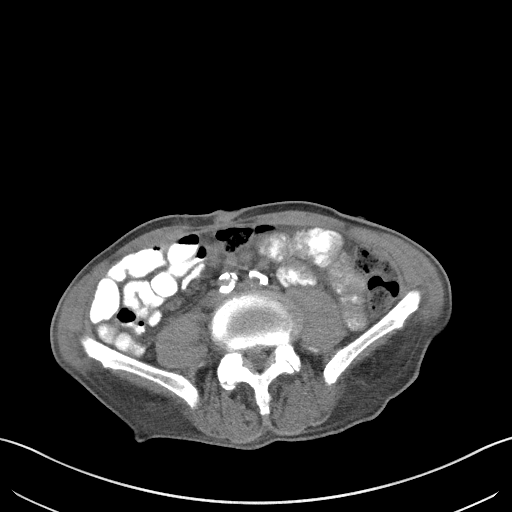
[im 55/94  soft-tissue]
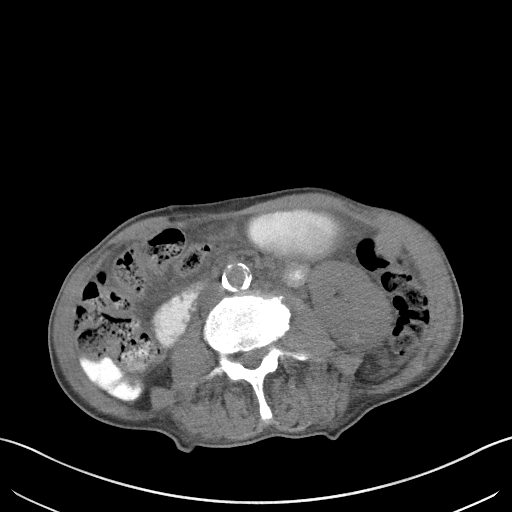
[im 63/94  soft-tissue]
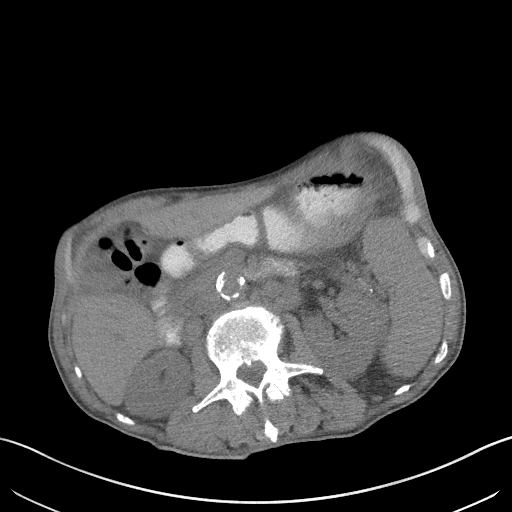
[im 70/94  soft-tissue]
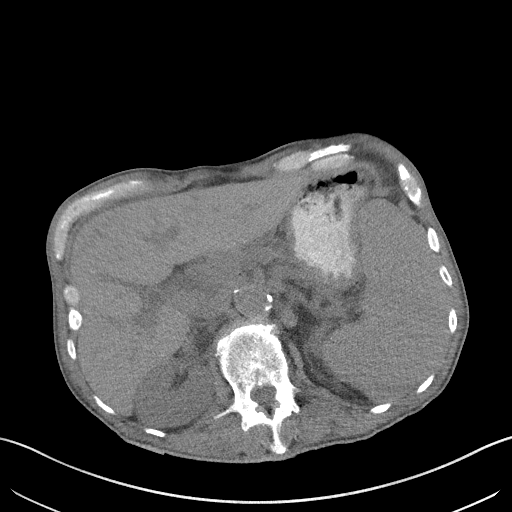
[im 70/94  bone]
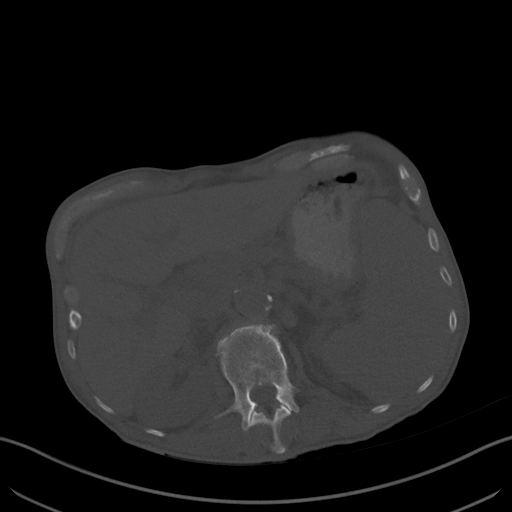
[im 78/94  soft-tissue]
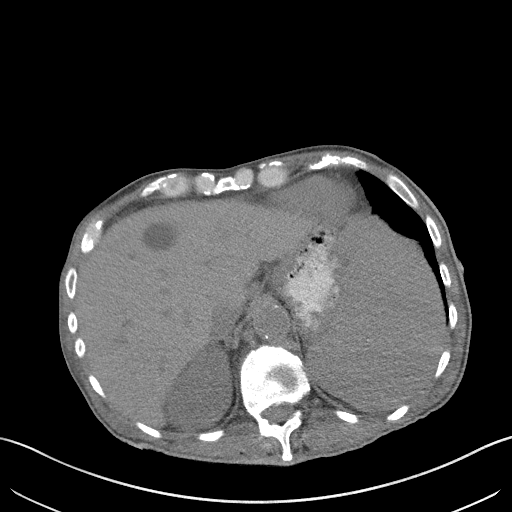
[im 86/94  soft-tissue]
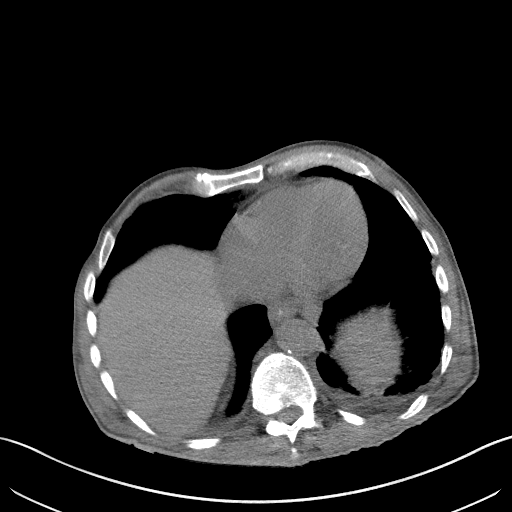

[Series 5: coronal st · coronal · 0.69mm/px · 3 of 84 slices shown]
[im 28/84  soft-tissue]
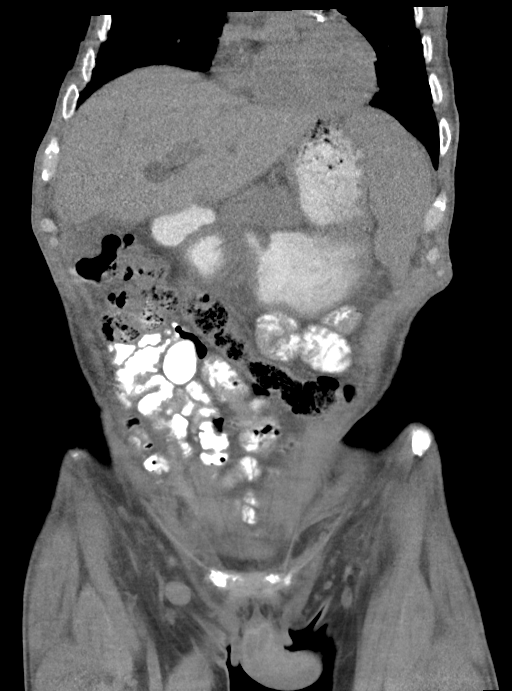
[im 37/84  soft-tissue]
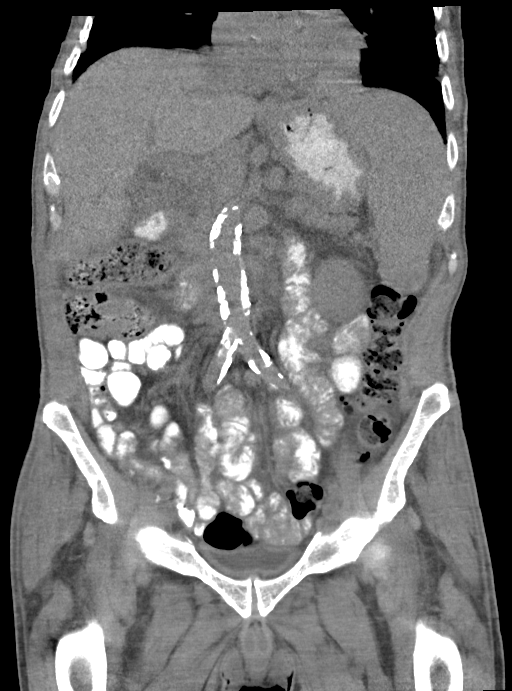
[im 47/84  soft-tissue]
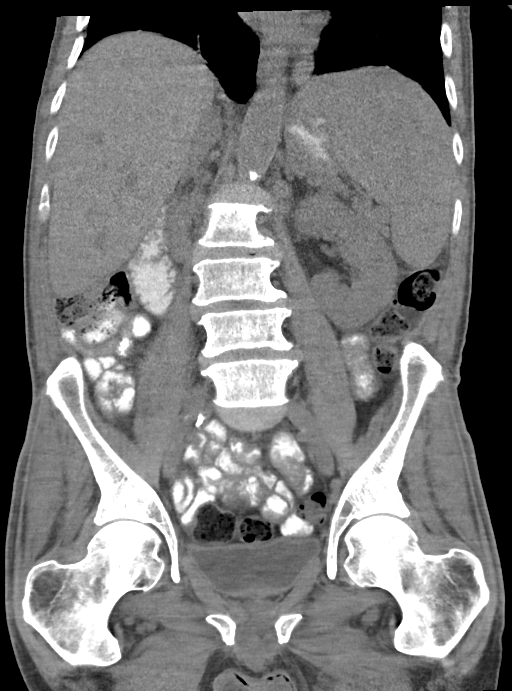

[14 of 46 positions shown; findings below may reference images not displayed]

FINDINGS: Lower chest: [DATE] x 1.2 cm left lower lobe pulmonary nodule (axial
image 3 of series 4). Atherosclerotic calcifications in the
descending thoracic aorta as well as the left main, left anterior
descending and right coronary arteries. Mild calcifications of the
aortic valve.

Hepatobiliary: Low-attenuation lesions are noted in the liver,
incompletely characterized on today's non-contrast CT examination,
but statistically likely to represent cysts, largest of which is in
segment 4A (axial image 19 of series 2) measuring 4.0 x 2.6 cm.
Amorphous intermediate attenuation material lying dependently in the
gallbladder, likely to represent biliary sludge. Gallbladder is
nearly decompressed.

Pancreas: No definite pancreatic mass or peripancreatic fluid
collections or inflammatory changes are noted on today's noncontrast
CT examination.

Spleen: Spleen is enlarged measuring 15.3 x 6.4 x 13.8 cm (estimated
splenic volume of 665 mL) .

Adrenals/Urinary Tract: 3 mm nonobstructive calculus in the
interpolar collecting system of the left kidney. No additional
calculi are noted within the right renal collecting system, along
the course of either ureter, or within the lumen of the urinary
bladder. No hydroureteronephrosis. Unenhanced appearance of the
kidneys is otherwise unremarkable. Bilateral adrenal glands are
normal in appearance. Urinary bladder is normal in appearance.

Stomach/Bowel: Unenhanced appearance of the stomach is normal. No
pathologic dilatation of small bowel or colon. The appendix is not
confidently identified and may be surgically absent. Regardless,
there are no inflammatory changes noted adjacent to the cecum to
suggest the presence of an acute appendicitis at this time.

Vascular/Lymphatic: Aortic atherosclerosis. Multiple enlarged
retroperitoneal lymph nodes, largest of which is in the left
para-aortic nodal station adjacent to the left renal hilum (axial
image 30 of series 3) measuring 1.8 x 1.5 cm.

Reproductive: Prostate gland and seminal vesicles are unremarkable
in appearance.

Other: No significant volume of ascites.  No pneumoperitoneum.

Musculoskeletal: There are no aggressive appearing lytic or blastic
lesions noted in the visualized portions of the skeleton. Chronic
appearing compression fracture of T12 with 25% loss of anterior
vertebral body height.
IMPRESSION: 1. Extensive retroperitoneal lymphadenopathy. There is also
splenomegaly. Clinical correlation for signs and symptoms of
lymphoproliferative disorder is recommended.
2. 1.6 x 1.2 cm left lower lobe pulmonary nodule concerning for
neoplasm. Consider one of the following in 3 months for both
low-risk and high-risk individuals: (a) repeat chest CT or (b)
follow-up PET-CT. This recommendation follows the consensus
statement: Guidelines for Management of Incidental Pulmonary Nodules
Detected on CT Images: From the [HOSPITAL] 6102; Radiology
6102; [DATE].
3. Probable biliary sludge lying dependently in the gallbladder.
4. Aortic atherosclerosis.
5. Additional incidental findings, as above.

## 2021-10-12 IMAGING — US US BIOPSY LYMPH NODE
1 series · 11 of 11 positions shown · non-contrast
Comparison: none

INDICATION: 69-year-old male presenting with weight loss, left lower lobe mass,
and lymphadenopathy.

[Series 1: us biopsy · 11 of 11 slices shown]
[im 1/11]
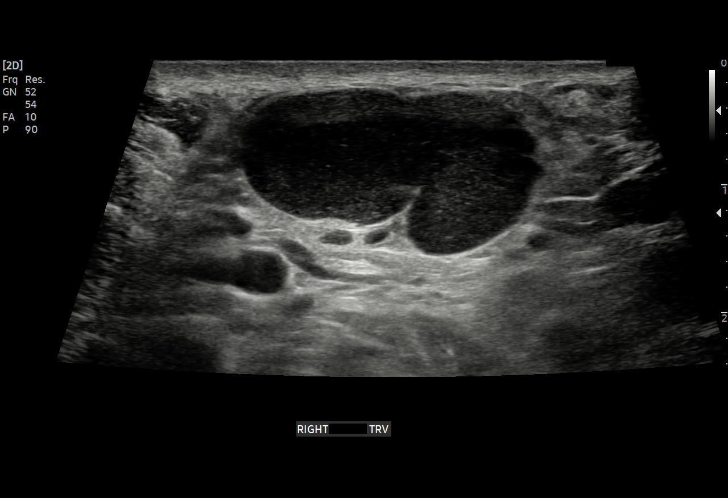
[im 2/11]
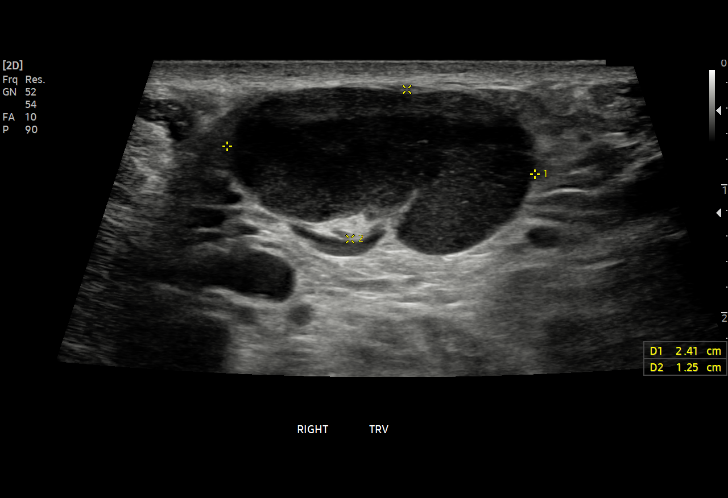
[im 3/11]
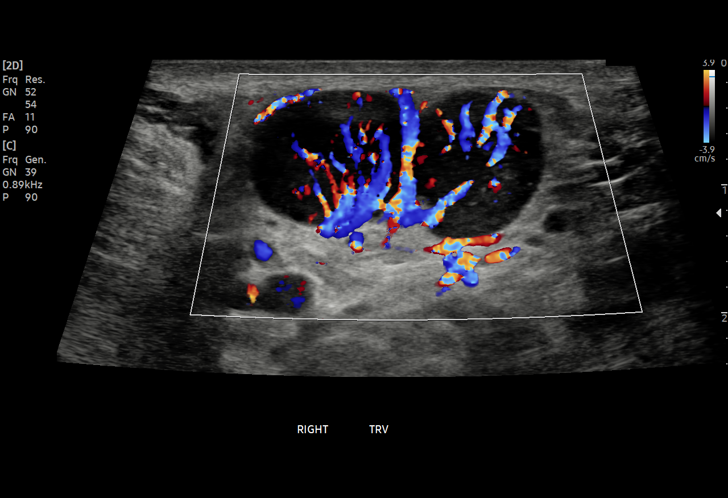
[im 4/11]
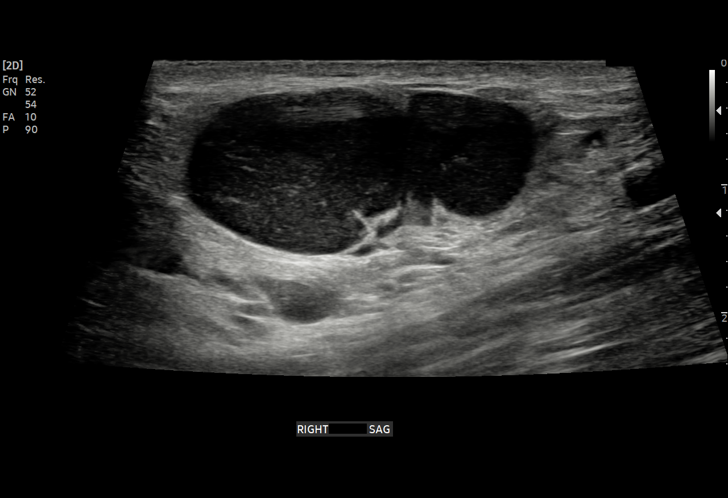
[im 5/11]
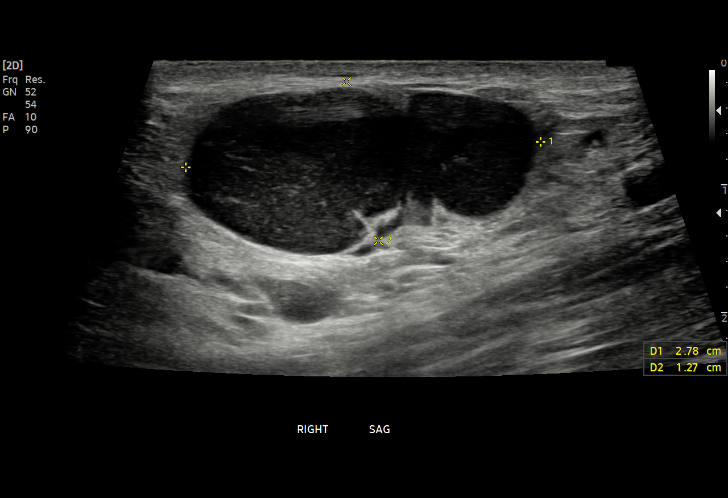
[im 6/11]
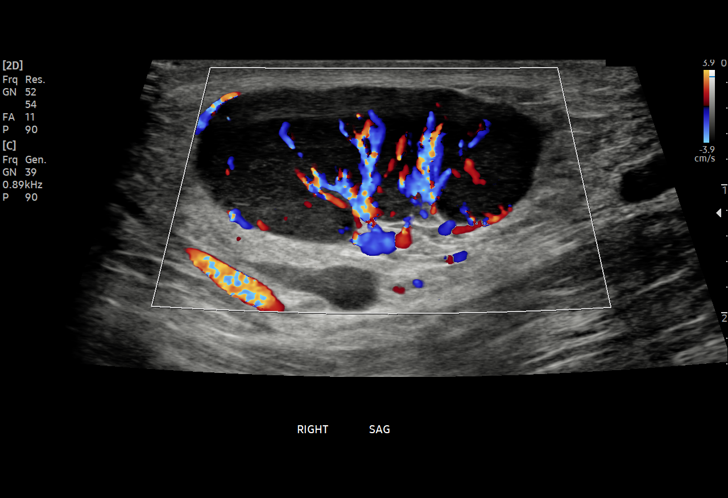
[im 7/11]
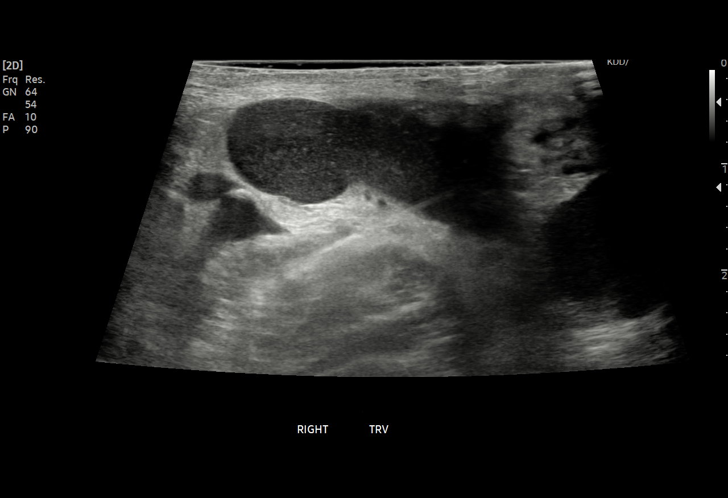
[im 8/11]
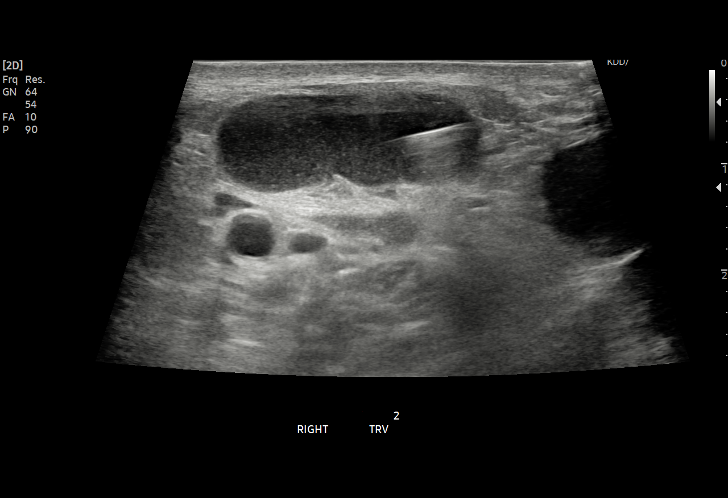
[im 9/11]
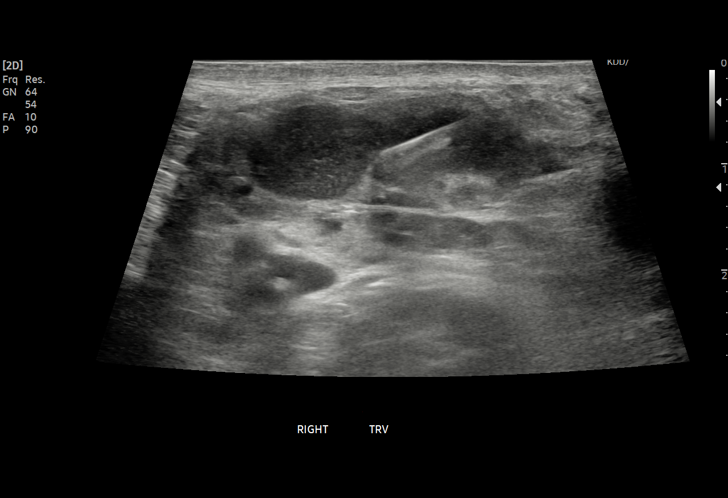
[im 10/11]
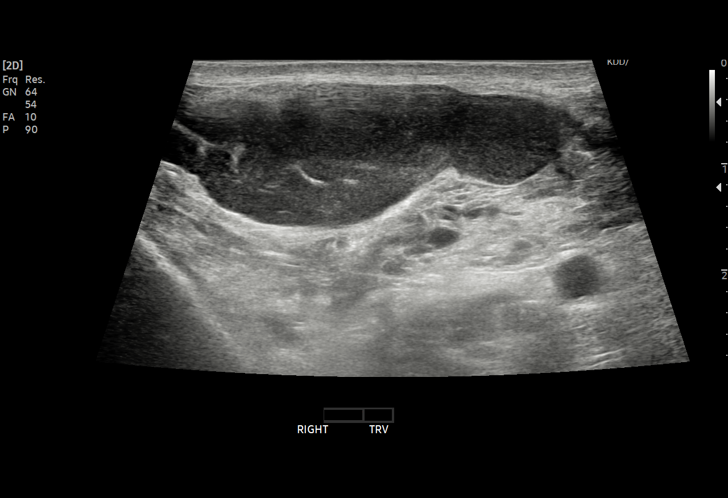
[im 11/11]
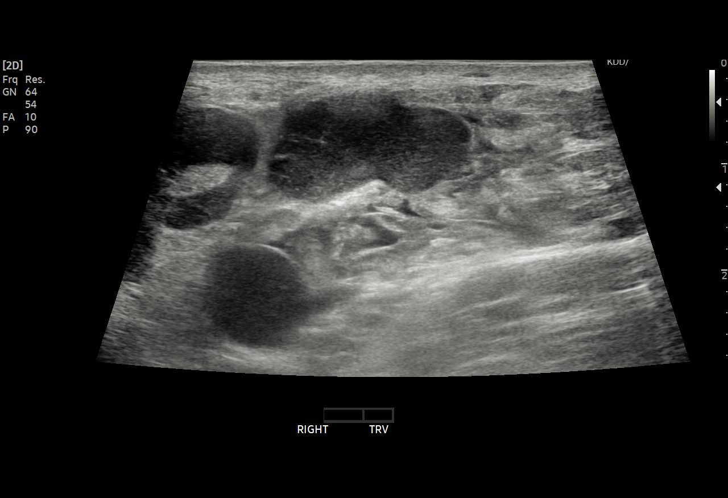

[11 of 11 positions shown; findings below may reference images not displayed]

EXAM:
ULTRASOUND GUIDED core BIOPSY OF right axillary lymph node.

MEDICATIONS:
None.

ANESTHESIA/SEDATION:
Fentanyl 25 mcg IV; Versed 0.5 mg IV

Moderate Sedation Time:  7

The patient was continuously monitored during the procedure by the
interventional radiology nurse under my direct supervision.

PROCEDURE:
The procedure, risks, benefits, and alternatives were explained to
the patient. Questions regarding the procedure were encouraged and
answered. The patient understands and consents to the procedure.

The right axilla was prepped with chlorhexidine in a sterile
fashion, and a sterile drape was applied covering the operative
field. A sterile gown and sterile gloves were used for the
procedure. Preprocedure ultrasound evaluation demonstrated multiple
prominent right axillary lymph nodes. The dominant node was selected
for biopsy. The procedure was planned. Local anesthesia was provided
with 1% Lidocaine. A small skin nick was made. Under direct
ultrasound visualization, a total of 3, 18 gauge core biopsies were
obtained. The samples were placed on saline soaked Telfa and sent to
Pathology. Postprocedural imaging demonstrated no evidence of
surrounding hematoma. Sterile bandage was placed. The patient
tolerated procedure well was transferred back to floor in stable
condition.

COMPLICATIONS:
None immediate.
FINDINGS: Right axillary lymphadenopathy.
IMPRESSION: Technically successful ultrasound-guided right axillary lymph node
biopsy.
